# Patient Record
Sex: Female | Born: 1985 | Race: Black or African American | Hispanic: No | Marital: Single | Smoking: Former smoker
Health system: Southern US, Community
[De-identification: ages and names within clinical notes are randomized; demographics above are authoritative.]

## PROBLEM LIST (undated history)

## (undated) DIAGNOSIS — J45909 Unspecified asthma, uncomplicated: Secondary | ICD-10-CM

## (undated) DIAGNOSIS — I10 Essential (primary) hypertension: Secondary | ICD-10-CM

---

## 2019-12-07 ENCOUNTER — Emergency Department (HOSPITAL_BASED_OUTPATIENT_CLINIC_OR_DEPARTMENT_OTHER)
Admission: EM | Admit: 2019-12-07 | Discharge: 2019-12-08 | Disposition: A | Discharge status: Home / Self Care | Payer: Medicaid Other | Attending: Emergency Medicine | Admitting: Emergency Medicine

## 2019-12-07 ENCOUNTER — Other Ambulatory Visit: Payer: Self-pay

## 2019-12-07 DIAGNOSIS — F17200 Nicotine dependence, unspecified, uncomplicated: Secondary | ICD-10-CM | POA: Insufficient documentation

## 2019-12-07 DIAGNOSIS — J45909 Unspecified asthma, uncomplicated: Secondary | ICD-10-CM | POA: Insufficient documentation

## 2019-12-07 DIAGNOSIS — Z20822 Contact with and (suspected) exposure to covid-19: Secondary | ICD-10-CM | POA: Insufficient documentation

## 2019-12-07 DIAGNOSIS — Z760 Encounter for issue of repeat prescription: Secondary | ICD-10-CM

## 2019-12-07 DIAGNOSIS — I10 Essential (primary) hypertension: Secondary | ICD-10-CM | POA: Insufficient documentation

## 2019-12-07 DIAGNOSIS — Q309 Congenital malformation of nose, unspecified: Secondary | ICD-10-CM

## 2019-12-07 HISTORY — DX: Essential (primary) hypertension: I10

## 2019-12-07 HISTORY — DX: Unspecified asthma, uncomplicated: J45.909

## 2019-12-08 ENCOUNTER — Encounter (HOSPITAL_BASED_OUTPATIENT_CLINIC_OR_DEPARTMENT_OTHER): Payer: Self-pay | Admitting: *Deleted

## 2019-12-08 LAB — RESPIRATORY PANEL BY RT PCR (FLU A&B, COVID)
Influenza A by PCR: NEGATIVE
Influenza B by PCR: NEGATIVE
SARS Coronavirus 2 by RT PCR: NEGATIVE

## 2019-12-08 MED ORDER — FLUTICASONE PROPIONATE 50 MCG/ACT NA SUSP: 2.0000 | Freq: Every day | NASAL | 0 refills | Status: DC

## 2019-12-08 MED ORDER — ALBUTEROL SULFATE HFA 108 (90 BASE) MCG/ACT IN AERS
1.0000 | INHALATION_SPRAY | Freq: Four times a day (QID) | RESPIRATORY_TRACT | 0 refills | Status: AC | PRN
Start: 2019-12-08 — End: ?

## 2019-12-08 NOTE — ED Provider Notes (Signed)
[facility] EMERGENCY DEPARTMENT Provider Note   CSN: [id number] Arrival date & time: 12/07/19  2352     History Chief Complaint  Patient presents with  . Asthma    Kristine Warner is a 34 y.o. female.  The history is provided by the patient.  Asthma This is a chronic problem. The current episode started more than 1 week ago. The problem occurs constantly. The problem has been resolved. Pertinent negatives include no chest pain, no abdominal pain, no headaches and no shortness of breath. Nothing aggravates the symptoms. Nothing relieves the symptoms. She has tried nothing for the symptoms. The treatment provided no relief.  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  Now is irritated.  No f/c/r.  No wheezing.  No fevers.       Past Medical History:  Diagnosis Date  . Asthma   . Hypertension     There are no problems to display for this patient.   History reviewed. No pertinent surgical history.   OB History   No obstetric history on file.     No family history on file.  Social History   Tobacco Use  . Smoking status: Current Every Day Smoker  Substance Use Topics  . Alcohol use: Not on file  . Drug use: Not on file    Home Medications Prior to Admission medications   Not on File    Allergies    Patient has no known allergies.  Review of Systems   Review of Systems  Constitutional: Negative for fever.  HENT: Negative for congestion.   Eyes: Negative for visual disturbance.  Respiratory: Negative for shortness of breath.   Cardiovascular: Negative for chest pain.  Gastrointestinal: Negative for abdominal pain.  Genitourinary: Negative for difficulty urinating.  Musculoskeletal: Negative for arthralgias.  Neurological: Negative for headaches.  Psychiatric/Behavioral: Negative for agitation.  All other systems reviewed and are negative.   Physical Exam Updated  Vital Signs BP (!) 160/100   Pulse (!) 104   Temp 99 F (37.2 C) (Oral)   Resp 18   Ht 5\' 11"  (1.803 m)   Wt (!) 189.6 kg   SpO2 100%   BMI 58.30 kg/m   Physical Exam Vitals and nursing note reviewed.  Constitutional:      Appearance: Normal appearance.  HENT:     Head: Normocephalic and atraumatic.     Nose:     Comments: Hole in septum no drainage  Eyes:     Conjunctiva/sclera: Conjunctivae normal.     Pupils: Pupils are equal, round, and reactive to light.  Cardiovascular:     Rate and Rhythm: Normal rate and regular rhythm.     Pulses: Normal pulses.     Heart sounds: Normal heart sounds.  Pulmonary:     Effort: Pulmonary effort is normal.     Breath sounds: Normal breath sounds.  Abdominal:     General: Abdomen is flat. Bowel sounds are normal.     Palpations: Abdomen is soft.     Tenderness: There is no abdominal tenderness. There is no guarding.  Musculoskeletal:        General: Normal range of motion.     Cervical back: Normal range of motion and neck supple.  Skin:    General: Skin is warm and dry.     Capillary Refill: Capillary refill takes less than 2 seconds.  Neurological:  General: No focal deficit present.     Mental Status: She is alert and oriented to person, place, and time.     Deep Tendon Reflexes: Reflexes normal.  Psychiatric:        Mood and Affect: Mood normal.        Behavior: Behavior normal.     ED Results / Procedures / Treatments   Labs (all labs ordered are listed, but only abnormal results are displayed) Labs Reviewed  RESPIRATORY PANEL BY RT PCR (FLU A&B, COVID)    EKG None  Radiology No results found.  Procedures Procedures (including critical care time)  Medications Ordered in ED Medications - No data to display  ED Course  I have reviewed the triage vital signs and the nursing notes.  Pertinent labs & imaging results that were available during my care of the patient were reviewed by me and considered in my  medical decision making (see chart for details).    Flonase nsal spray and follow up with ENT, will refill lost inhaler.    Kristine Warner was evaluated in Emergency Department on 12/08/2019 for the symptoms described in the history of present illness. She was evaluated in the context of the global COVID-19 pandemic, which necessitated consideration that the patient might be at risk for infection with the SARS-CoV-2 virus that causes COVID-19. Institutional protocols and algorithms that pertain to the evaluation of patients at risk for COVID-19 are in a state of rapid change based on information released by regulatory bodies including the CDC and federal and state organizations. These policies and algorithms were followed during the patient's care in the ED.  Final Clinical Impression(s) / ED Diagnoses Return for intractable cough, coughing up blood,fevers >100.4 unrelieved by medication, shortness of breath, intractable vomiting, chest pain, shortness of breath, weakness,numbness, changes in speech, facial asymmetry,abdominal pain, passing out,Inability to tolerate liquids or food, cough, altered mental status or any concerns. No signs of systemic illness or infection. The patient is nontoxic-appearing on exam and vital signs are within normal limits.   I have reviewed the triage vital signs and the nursing notes. Pertinent labs &imaging results that were available during my care of the patient were reviewed by me and considered in my medical decision making (see chart for details).After history, exam, and medical workup I feel the patient has beenappropriately medically screened and is safe for discharge home. Pertinent diagnoses were discussed with the patient. Patient was given return precautions.   Namine Beahm, MD 12/08/19 0023

## 2019-12-08 NOTE — ED Notes (Signed)
Discharge instructions including prescriptions discussed with patient. Verbalized understanding. Departs ED at this time in stable condition.

## 2020-11-17 ENCOUNTER — Encounter (HOSPITAL_BASED_OUTPATIENT_CLINIC_OR_DEPARTMENT_OTHER): Payer: Self-pay | Admitting: *Deleted

## 2020-11-17 ENCOUNTER — Emergency Department (HOSPITAL_BASED_OUTPATIENT_CLINIC_OR_DEPARTMENT_OTHER): Payer: Medicaid Other

## 2020-11-17 ENCOUNTER — Other Ambulatory Visit: Payer: Self-pay

## 2020-11-17 ENCOUNTER — Emergency Department (HOSPITAL_BASED_OUTPATIENT_CLINIC_OR_DEPARTMENT_OTHER)
Admission: EM | Admit: 2020-11-17 | Discharge: 2020-11-17 | Disposition: A | Discharge status: Home / Self Care | Payer: Medicaid Other | Attending: Emergency Medicine | Admitting: Emergency Medicine

## 2020-11-17 DIAGNOSIS — I10 Essential (primary) hypertension: Secondary | ICD-10-CM | POA: Diagnosis not present

## 2020-11-17 DIAGNOSIS — Z87891 Personal history of nicotine dependence: Secondary | ICD-10-CM | POA: Diagnosis not present

## 2020-11-17 DIAGNOSIS — R0602 Shortness of breath: Secondary | ICD-10-CM | POA: Diagnosis present

## 2020-11-17 DIAGNOSIS — J45901 Unspecified asthma with (acute) exacerbation: Secondary | ICD-10-CM | POA: Insufficient documentation

## 2020-11-17 DIAGNOSIS — Z7951 Long term (current) use of inhaled steroids: Secondary | ICD-10-CM | POA: Insufficient documentation

## 2020-11-17 DIAGNOSIS — Z20822 Contact with and (suspected) exposure to covid-19: Secondary | ICD-10-CM | POA: Insufficient documentation

## 2020-11-17 DIAGNOSIS — Z79899 Other long term (current) drug therapy: Secondary | ICD-10-CM | POA: Diagnosis not present

## 2020-11-17 LAB — BRAIN NATRIURETIC PEPTIDE: B Natriuretic Peptide: 20 pg/mL (ref 0.0–100.0)

## 2020-11-17 LAB — CBC WITH DIFFERENTIAL/PLATELET
Abs Immature Granulocytes: 0.03 10*3/uL (ref 0.00–0.07)
Basophils Absolute: 0.1 10*3/uL (ref 0.0–0.1)
Basophils Relative: 1 %
Eosinophils Absolute: 0.9 10*3/uL — ABNORMAL HIGH (ref 0.0–0.5)
Eosinophils Relative: 8 %
HCT: 38.5 % (ref 36.0–46.0)
Hemoglobin: 12.4 g/dL (ref 12.0–15.0)
Immature Granulocytes: 0 %
Lymphocytes Relative: 31 %
Lymphs Abs: 3.3 10*3/uL (ref 0.7–4.0)
MCH: 31.2 pg (ref 26.0–34.0)
MCHC: 32.2 g/dL (ref 30.0–36.0)
MCV: 96.7 fL (ref 80.0–100.0)
Monocytes Absolute: 0.5 10*3/uL (ref 0.1–1.0)
Monocytes Relative: 5 %
Neutro Abs: 5.8 10*3/uL (ref 1.7–7.7)
Neutrophils Relative %: 55 %
Platelets: 393 10*3/uL (ref 150–400)
RBC: 3.98 MIL/uL (ref 3.87–5.11)
RDW: 14.4 % (ref 11.5–15.5)
WBC: 10.6 10*3/uL — ABNORMAL HIGH (ref 4.0–10.5)
nRBC: 0 % (ref 0.0–0.2)

## 2020-11-17 LAB — RESP PANEL BY RT-PCR (FLU A&B, COVID) ARPGX2
Influenza A by PCR: NEGATIVE
Influenza B by PCR: NEGATIVE
SARS Coronavirus 2 by RT PCR: NEGATIVE

## 2020-11-17 LAB — BASIC METABOLIC PANEL
Anion gap: 8 (ref 5–15)
BUN: 15 mg/dL (ref 6–20)
CO2: 24 mmol/L (ref 22–32)
Calcium: 8.5 mg/dL — ABNORMAL LOW (ref 8.9–10.3)
Chloride: 103 mmol/L (ref 98–111)
Creatinine, Ser: 1.03 mg/dL — ABNORMAL HIGH (ref 0.44–1.00)
GFR, Estimated: >60 mL/min (ref >60)
Glucose, Bld: 114 mg/dL — ABNORMAL HIGH (ref 70–99)
Potassium: 3.6 mmol/L (ref 3.5–5.1)
Sodium: 135 mmol/L (ref 135–145)

## 2020-11-17 LAB — HCG, SERUM, QUALITATIVE: Preg, Serum: NEGATIVE

## 2020-11-17 MED ORDER — CETIRIZINE HCL 10 MG PO TABS: 10.0000 mg | ORAL_TABLET | Freq: Every day | ORAL | 0 refills | Status: AC

## 2020-11-17 MED ORDER — METHYLPREDNISOLONE SODIUM SUCC 125 MG IJ SOLR
125.0000 mg | INTRAMUSCULAR | Status: AC
  Administered 2020-11-17: 125 mg via INTRAVENOUS
  Filled 2020-11-17: qty 2

## 2020-11-17 MED ORDER — MAGNESIUM SULFATE 2 GM/50ML IV SOLN
2.0000 g | Freq: Once | INTRAVENOUS | Status: AC
  Administered 2020-11-17: 2 g via INTRAVENOUS
  Filled 2020-11-17: qty 50

## 2020-11-17 MED ORDER — SODIUM CHLORIDE 0.9 % IV SOLN: INTRAVENOUS | Status: DC | PRN

## 2020-11-17 MED ORDER — IPRATROPIUM BROMIDE 0.02 % IN SOLN
RESPIRATORY_TRACT | Status: AC
  Administered 2020-11-17: 0.5 mg
  Filled 2020-11-17: qty 2.5

## 2020-11-17 MED ORDER — ALBUTEROL SULFATE HFA 108 (90 BASE) MCG/ACT IN AERS
1.0000 | INHALATION_SPRAY | Freq: Once | RESPIRATORY_TRACT | Status: AC
  Administered 2020-11-17: 1 via RESPIRATORY_TRACT
  Filled 2020-11-17: qty 6.7

## 2020-11-17 MED ORDER — ALBUTEROL SULFATE (2.5 MG/3ML) 0.083% IN NEBU
INHALATION_SOLUTION | RESPIRATORY_TRACT | Status: AC
  Administered 2020-11-17: 5 mg
  Filled 2020-11-17: qty 6

## 2020-11-17 MED ORDER — AEROCHAMBER PLUS FLO-VU MEDIUM MISC
1.0000 | Freq: Once | Status: AC
  Administered 2020-11-17: 1
  Filled 2020-11-17: qty 1

## 2020-11-17 MED ORDER — PREDNISONE 10 MG PO TABS: 40.0000 mg | ORAL_TABLET | Freq: Every day | ORAL | 0 refills | Status: DC

## 2020-11-17 MED ORDER — FLUTICASONE PROPIONATE 50 MCG/ACT NA SUSP: 2.0000 | Freq: Every day | NASAL | 0 refills | Status: AC

## 2020-11-17 NOTE — ED Provider Notes (Signed)
[facility] EMERGENCY DEPARTMENT Provider Note   CSN: [id number] Arrival date & time: 11/17/20  1553     History Chief Complaint  Patient presents with   Asthma    Kristine Warner is a 35 y.o. female.  HPI Patient is a 35 year old female with past medical history significant for asthma hypertension she is presented to the emergency room today with complaints of shortness of breath, wheezing, cough states that her symptoms began 20 to 30 minutes prior to arrival in the emergency room.  She states she feels as her symptoms came on relatively abruptly she states that she does work for a [employer] it was in a house with somebody without a dog.  She states that she has no history of anaphylaxis.  Has never had to have epinephrine or to be intubated for her asthma.  She uses a inhaled corticosteroid inhaler every day and did not use it today however.  Does not have a rescue inhaler   She denies any chest pain but states that she feels quite short of breath.  No fevers or chills.  No hemoptysis no history of blood clots or cancer.  No leg pain or swelling.      Past Medical History:  Diagnosis Date   Asthma    Hypertension     There are no problems to display for this patient.   History reviewed. No pertinent surgical history.   OB History   No obstetric history on file.     No family history on file.  Social History   Tobacco Use   Smoking status: Former    Types: Cigarettes   Smokeless tobacco: Never  Vaping Use   Vaping Use: Every day  Substance Use Topics   Alcohol use: Yes   Drug use: Yes    Types: Cocaine    Home Medications Prior to Admission medications   Medication Sig Start Date End Date Taking? Authorizing Provider  albuterol (VENTOLIN HFA) 108 (90 Base) MCG/ACT inhaler Inhale 1-2 puffs into the lungs every 6 (six) hours as needed for wheezing or shortness of breath. 12/08/19  Yes Palumbo, April, MD  budesonide-formoterol [facility])  160-4.5 MCG/ACT inhaler Inhale 2 puffs into the lungs 2 (two) times daily.   Yes [provider]  cetirizine (ZYRTEC ALLERGY) 10 MG tablet Take 1 tablet (10 mg total) by mouth daily. 11/17/20  Yes Leighanne Adolph S, PA  fluticasone (FLONASE) 50 MCG/ACT nasal spray Place 2 sprays into both nostrils daily for 14 days. 11/17/20 12/01/20 Yes Al[place]ace Dowd S, PA  hydrochlorothiazide (MICROZIDE) 12.5 MG capsule Take 12.5 mg by mouth daily.   Yes [provider]  lisinopril (ZESTRIL) 10 MG tablet Take 10 mg by mouth daily.   Yes [provider]  predniSONE (DELTASONE) 10 MG tablet Take 4 tablets (40 mg total) by mouth daily. 11/17/20  Yes Gailen Shelter, PA    Allergies    Patient has no known allergies.  Review of Systems   Review of Systems  Constitutional:  Negative for chills and fever.  HENT:  Negative for congestion.   Eyes:  Negative for pain.  Respiratory:  Positive for cough, shortness of breath and wheezing.   Cardiovascular:  Negative for chest pain and leg swelling.  Gastrointestinal:  Negative for abdominal pain and vomiting.  Genitourinary:  Negative for dysuria.  Musculoskeletal:  Negative for myalgias.  Skin:  Negative for rash.  Neurological:  Negative for dizziness and headaches.   Physical Exam Updated Vital  Signs BP (!) 144/89   Pulse 91   Temp (!) 97.5 F (36.4 C) (Tympanic)   Resp 16   Ht 5' 11.5" (1.816 m)   Wt (!) 189.6 kg   SpO2 98%   BMI 57.49 kg/m   Physical Exam Vitals and nursing note reviewed.  Constitutional:      Comments: Uncomfortable appearing 35 year old female audibly wheezing, increased work of breathing, tachypneic  HENT:     Head: Normocephalic and atraumatic.     Nose: Nose normal.     Mouth/Throat:     Mouth: Mucous membranes are moist.  Eyes:     General: No scleral icterus. Cardiovascular:     Rate and Rhythm: Normal rate and regular rhythm.     Pulses: Normal pulses.     Heart sounds: Normal heart  sounds.  Pulmonary:     Breath sounds: Wheezing present.     Comments: Tachypneic, increased expiratory phase, increased work of breathing  Diffuse wheezing throughout all lung fields.  Reasonable tidal volumes however. Able to speak in short but complete sentences. Abdominal:     Palpations: Abdomen is soft.     Tenderness: There is no abdominal tenderness. There is no guarding or rebound.  Musculoskeletal:     Cervical back: Normal range of motion.     Right lower leg: No edema.     Left lower leg: No edema.     Comments: No lower extremity edema or calf tenderness  Skin:    General: Skin is warm and dry.     Capillary Refill: Capillary refill takes less than 2 seconds.  Neurological:     Mental Status: She is alert. Mental status is at baseline.  Psychiatric:        Mood and Affect: Mood normal.        Behavior: Behavior normal.    ED Results / Procedures / Treatments   Labs (all labs ordered are listed, but only abnormal results are displayed) Labs Reviewed  CBC WITH DIFFERENTIAL/PLATELET - Abnormal; Notable for the following components:      Result Value   WBC 10.6 (*)    Eosinophils Absolute 0.9 (*)    All other components within normal limits  BASIC METABOLIC PANEL - Abnormal; Notable for the following components:   Glucose, Bld 114 (*)    Creatinine, Ser 1.03 (*)    Calcium 8.5 (*)    All other components within normal limits  RESP PANEL BY RT-PCR (FLU A&B, COVID) [id number]  BRAIN NATRIURETIC PEPTIDE  HCG, SERUM, QUALITATIVE    EKG None  Radiology DG Chest Portable 1 View  Result Date: 11/17/2020 CLINICAL DATA:  Shortness of breath, history of asthma. EXAM: PORTABLE CHEST 1 VIEW COMPARISON:  May 22, 2017. FINDINGS: EKG leads project over the chest. Cardiomediastinal contours and hilar structures are normal. No consolidation.  No pleural effusion. On limited assessment there is no acute skeletal process. IMPRESSION: No acute cardiopulmonary disease.  Electronically Signed   By: Donzetta Kohut M.D.   On: 11/17/2020 16:35    Procedures Procedures   Medications Ordered in ED Medications  0.9 %  sodium chloride infusion (0 mLs Intravenous Stopped 11/17/20 1821)  albuterol (PROVENTIL) (2.5 MG/3ML) 0.083% nebulizer solution (5 mg  Given 11/17/20 1602)  ipratropium (ATROVENT) 0.02 % nebulizer solution (0.5 mg  Given 11/17/20 1602)  methylPREDNISolone sodium succinate (SOLU-MEDROL) 125 mg/2 mL injection 125 mg (125 mg Intravenous Given 11/17/20 1637)  magnesium sulfate IVPB 2 g 50 mL (0 g Intravenous  Stopped 11/17/20 1807)  albuterol (VENTOLIN HFA) 108 (90 Base) MCG/ACT inhaler 1 puff (1 puff Inhalation Given 11/17/20 1813)  AeroChamber Plus Flo-Vu Medium MISC 1 each (1 each Other Given 11/17/20 1814)    ED Course  I have reviewed the triage vital signs and the nursing notes.  Pertinent labs & imaging results that were available during my care of the patient were reviewed by me and considered in my medical decision making (see chart for details).    MDM Rules/Calculators/A&P                          Patient initially presented to the ER with increased work of breathing tachypneic and wheezing.  Also tachycardic  Has not used her ICS inhaler today. Does not have an albuterol rescue inhaler  Wheezing diffusely.   Patient started on continuous nebulizer with DuoNeb.  Given magnesium and Solu-Medrol.  I reevaluated patient.  5 minutes x 3.  Work of breathing significantly improved.  Tachycardia and tachypnea resolved completely.  She is no longer wheezing on my reexamination.  COVID and influenza negative.  BMP unremarkable.  hCG negative for pregnancy.  BMP unremarkable CBC with very mild leukocytosis no anemia.  EKG nonischemic.  No chest pain.  Chest x-ray unremarkable.  No infiltrates.  On my reassessment prior to discharge patient continues to be breathing well.  States that she feels much improved.  Ambulate without  difficulty pulse ox 98% on room air.  Provide patient with albuterol rescue inhaler and spacer We will discharge patient home with strict return precautions.  She is understanding of plan and agreeable.  All questions answered best my ability.  Kristine Warner was evaluated in Emergency Department on 11/18/2020 for the symptoms described in the history of present illness. She was evaluated in the context of the global COVID-19 pandemic, which necessitated consideration that the patient might be at risk for infection with the SARS-CoV-2 virus that causes COVID-19. Institutional protocols and algorithms that pertain to the evaluation of patients at risk for COVID-19 are in a state of rapid change based on information released by regulatory bodies including the CDC and federal and state organizations. These policies and algorithms were followed during the patient's care in the ED.   Final Clinical Impression(s) / ED Diagnoses Final diagnoses:  Moderate asthma with exacerbation, unspecified whether persistent    Rx / DC Orders ED Discharge Orders          Ordered    predniSONE (DELTASONE) 10 MG tablet  Daily        11/17/20 1804    fluticasone (FLONASE) 50 MCG/ACT nasal spray  Daily        11/17/20 1804    cetirizine (ZYRTEC ALLERGY) 10 MG tablet  Daily        11/17/20 1804             Solon Augusta Bovina, [place] 11/18/20 0046    Alvira Monday, MD 11/19/20 Paulo Fruit

## 2020-11-17 NOTE — ED Notes (Signed)
PEFR 360 x 3. Charted on flow sheet. Patient says she feels much better. No distress noted

## 2020-11-17 NOTE — Discharge Instructions (Signed)
.  Please follow-up with your primary care provider.  Please continue to use your inhaled corticosteroid.  Please use the albuterol rescue inhaler I prescribed you and that you received here in the ER as needed as prescribed.  Please take the entire course of prednisone  I have also prescribed you Zyrtec and fluticasone.  Untreated allergies can worsen asthma attacks.  Please return to the ER for any new or concerning symptoms.

## 2020-11-17 NOTE — ED Triage Notes (Addendum)
Pt  with hx of asthma. Reports SOB x "20 mins". States started at work. Provider at bedside during triage in room 14. Audible wheezing. RR 40s. RT at bedside. States she left her rescue inhaler at home

## 2021-03-25 ENCOUNTER — Encounter (HOSPITAL_BASED_OUTPATIENT_CLINIC_OR_DEPARTMENT_OTHER): Payer: Self-pay

## 2021-03-25 ENCOUNTER — Emergency Department (HOSPITAL_BASED_OUTPATIENT_CLINIC_OR_DEPARTMENT_OTHER)
Admission: EM | Admit: 2021-03-25 | Discharge: 2021-03-25 | Disposition: A | Discharge status: Home / Self Care | Payer: Medicaid Other | Attending: Emergency Medicine | Admitting: Emergency Medicine

## 2021-03-25 ENCOUNTER — Other Ambulatory Visit: Payer: Self-pay

## 2021-03-25 ENCOUNTER — Emergency Department (HOSPITAL_BASED_OUTPATIENT_CLINIC_OR_DEPARTMENT_OTHER): Payer: Medicaid Other

## 2021-03-25 DIAGNOSIS — Z7952 Long term (current) use of systemic steroids: Secondary | ICD-10-CM | POA: Diagnosis not present

## 2021-03-25 DIAGNOSIS — J4541 Moderate persistent asthma with (acute) exacerbation: Secondary | ICD-10-CM | POA: Diagnosis not present

## 2021-03-25 DIAGNOSIS — Z7951 Long term (current) use of inhaled steroids: Secondary | ICD-10-CM | POA: Insufficient documentation

## 2021-03-25 DIAGNOSIS — Z20822 Contact with and (suspected) exposure to covid-19: Secondary | ICD-10-CM | POA: Insufficient documentation

## 2021-03-25 DIAGNOSIS — J45901 Unspecified asthma with (acute) exacerbation: Secondary | ICD-10-CM

## 2021-03-25 DIAGNOSIS — R0602 Shortness of breath: Secondary | ICD-10-CM | POA: Diagnosis present

## 2021-03-25 LAB — CBC WITH DIFFERENTIAL/PLATELET
Abs Immature Granulocytes: 0.01 10*3/uL (ref 0.00–0.07)
Basophils Absolute: 0 10*3/uL (ref 0.0–0.1)
Basophils Relative: 1 %
Eosinophils Absolute: 0.4 10*3/uL (ref 0.0–0.5)
Eosinophils Relative: 4 %
HCT: 39.2 % (ref 36.0–46.0)
Hemoglobin: 12.7 g/dL (ref 12.0–15.0)
Immature Granulocytes: 0 %
Lymphocytes Relative: 37 %
Lymphs Abs: 3.3 10*3/uL (ref 0.7–4.0)
MCH: 31.1 pg (ref 26.0–34.0)
MCHC: 32.4 g/dL (ref 30.0–36.0)
MCV: 95.8 fL (ref 80.0–100.0)
Monocytes Absolute: 0.5 10*3/uL (ref 0.1–1.0)
Monocytes Relative: 5 %
Neutro Abs: 4.8 10*3/uL (ref 1.7–7.7)
Neutrophils Relative %: 53 %
Platelets: 317 10*3/uL (ref 150–400)
RBC: 4.09 MIL/uL (ref 3.87–5.11)
RDW: 14.6 % (ref 11.5–15.5)
WBC: 8.9 10*3/uL (ref 4.0–10.5)
nRBC: 0 % (ref 0.0–0.2)

## 2021-03-25 LAB — RESP PANEL BY RT-PCR (FLU A&B, COVID) ARPGX2
Influenza A by PCR: NEGATIVE
Influenza B by PCR: NEGATIVE
SARS Coronavirus 2 by RT PCR: NEGATIVE

## 2021-03-25 LAB — BASIC METABOLIC PANEL
Anion gap: 5 (ref 5–15)
BUN: 12 mg/dL (ref 6–20)
CO2: 25 mmol/L (ref 22–32)
Calcium: 8.8 mg/dL — ABNORMAL LOW (ref 8.9–10.3)
Chloride: 107 mmol/L (ref 98–111)
Creatinine, Ser: 0.84 mg/dL (ref 0.44–1.00)
GFR, Estimated: >60 mL/min (ref >60)
Glucose, Bld: 85 mg/dL (ref 70–99)
Potassium: 3.9 mmol/L (ref 3.5–5.1)
Sodium: 137 mmol/L (ref 135–145)

## 2021-03-25 MED ORDER — DIPHENHYDRAMINE HCL 50 MG/ML IJ SOLN
25.0000 mg | Freq: Once | INTRAMUSCULAR | Status: AC
  Administered 2021-03-25: 25 mg via INTRAVENOUS
  Filled 2021-03-25: qty 1

## 2021-03-25 MED ORDER — IPRATROPIUM-ALBUTEROL 0.5-2.5 (3) MG/3ML IN SOLN
RESPIRATORY_TRACT | Status: AC
  Filled 2021-03-25: qty 3

## 2021-03-25 MED ORDER — IPRATROPIUM-ALBUTEROL 0.5-2.5 (3) MG/3ML IN SOLN
3.0000 mL | RESPIRATORY_TRACT | Status: AC
  Administered 2021-03-25: 3 mL via RESPIRATORY_TRACT
  Filled 2021-03-25: qty 3

## 2021-03-25 MED ORDER — PREDNISONE 10 MG (21) PO TBPK: ORAL_TABLET | Freq: Every day | ORAL | 0 refills | Status: DC

## 2021-03-25 MED ORDER — ALBUTEROL SULFATE HFA 108 (90 BASE) MCG/ACT IN AERS
4.0000 | INHALATION_SPRAY | Freq: Once | RESPIRATORY_TRACT | Status: AC
  Administered 2021-03-25: 4 via RESPIRATORY_TRACT
  Filled 2021-03-25: qty 6.7

## 2021-03-25 MED ORDER — METHYLPREDNISOLONE SODIUM SUCC 125 MG IJ SOLR
125.0000 mg | INTRAMUSCULAR | Status: AC
  Administered 2021-03-25: 125 mg via INTRAVENOUS
  Filled 2021-03-25: qty 2

## 2021-03-25 MED ORDER — MAGNESIUM SULFATE 2 GM/50ML IV SOLN
2.0000 g | Freq: Once | INTRAVENOUS | Status: AC
  Administered 2021-03-25: 2 g via INTRAVENOUS
  Filled 2021-03-25: qty 50

## 2021-03-25 MED ORDER — PROMETHAZINE-DM 6.25-15 MG/5ML PO SYRP: 5.0000 mL | ORAL_SOLUTION | Freq: Four times a day (QID) | ORAL | 0 refills | Status: AC | PRN

## 2021-03-25 NOTE — Discharge Instructions (Addendum)
Please use the steroids I prescribed you.  Please follow-up closely with your primary care provider.

## 2021-03-25 NOTE — ED Triage Notes (Addendum)
Pt reports she was at clients house and dog ran across lap. Eyes became itchy. since 230pm having difficulty breathing.  Denies any difficulty swallowing.

## 2021-03-25 NOTE — ED Notes (Signed)
Pt A&OX4 ambulatory at d/c with independent steady gait, NAD. Pt verbalized understanding of d/c instructions, prescription and follow up care.Pt reporting she is breathing better and is feeling better.

## 2021-03-25 NOTE — Progress Notes (Signed)
Albuterol MD given to patient to take home. Stated she has an spacer and does not need another

## 2021-03-25 NOTE — ED Provider Notes (Signed)
[facility] EMERGENCY DEPARTMENT Provider Note   CSN: [id number] Arrival date & time: 03/25/21  1535     History  Chief Complaint  Patient presents with   Wheezing   Shortness of Breath    Kristine Warner is a 36 y.o. female.   Wheezing Associated symptoms: shortness of breath   Shortness of Breath Associated symptoms: wheezing    Patient is a 36 year old female longstanding history of asthma she states that at 2:30 PM an hour and a half ago from now she started having wheezing and shortness of breath she states that she was at a client's house and a dog ran across her lap just before this began however she states she has a history of allergies to animals dogs and she denies any rashes nausea vomiting diarrhea lip or tongue swelling difficulty swallowing.  She denies any fevers chills states that she does feel that she has some phlegm in her throat however.  States that she uses 2-3 doses of MDI albuterol per day and just ran out of her albuterol yesterday.  She states that she also uses Symbicort 2 puffs/day and has been compliant with this.  She states that she had 3 or 4 episodes of asthma exacerbations last year has never been intubated.     Home Medications Prior to Admission medications   Medication Sig Start Date End Date Taking? Authorizing Provider  predniSONE (STERAPRED UNI-PAK 21 TAB) 10 MG (21) TBPK tablet Take by mouth daily. Take 6 tabs by mouth daily  for 2 days, then 5 tabs for 2 days, then 4 tabs for 2 days, then 3 tabs for 2 days, 2 tabs for 2 days, then 1 tab by mouth daily for 2 days 03/25/21  Yes Marymargaret Kirker S, PA  promethazine-dextromethorphan (PROMETHAZINE-DM) 6.25-15 MG/5ML syrup Take 5 mLs by mouth 4 (four) times daily as needed for cough. 03/25/21  Yes Keegan Bensch S, PA  albuterol (VENTOLIN HFA) 108 (90 Base) MCG/ACT inhaler Inhale 1-2 puffs into the lungs every 6 (six) hours as needed for wheezing or shortness of breath. 12/08/19    Palumbo, April, MD  budesonide-formoterol [facility]) 160-4.5 MCG/ACT inhaler Inhale 2 puffs into the lungs 2 (two) times daily.    [provider]  cetirizine (ZYRTEC ALLERGY) 10 MG tablet Take 1 tablet (10 mg total) by mouth daily. 11/17/20   Nasha Diss, Rodrigo Ran, PA  fluticasone (FLONASE) 50 MCG/ACT nasal spray Place 2 sprays into both nostrils daily for 14 days. 11/17/20 12/01/20  Gailen Shelter, PA  hydrochlorothiazide (MICROZIDE) 12.5 MG capsule Take 12.5 mg by mouth daily.    [provider]  lisinopril (ZESTRIL) 10 MG tablet Take 10 mg by mouth daily.    [provider]      Allergies    Patient has no known allergies.    Review of Systems   Review of Systems  Respiratory:  Positive for shortness of breath and wheezing.    Physical Exam Updated Vital Signs BP (!) 145/111    Pulse 90    Temp 98.6 F (37 C) (Oral)    Resp 13    SpO2 94%  Physical Exam Vitals and nursing note reviewed.  Constitutional:      General: She is in acute distress.  HENT:     Head: Normocephalic and atraumatic.     Nose: Nose normal.     Mouth/Throat:     Mouth: Mucous membranes are moist.  Eyes:     General: No scleral  icterus. Cardiovascular:     Rate and Rhythm: Normal rate and regular rhythm.     Pulses: Normal pulses.     Heart sounds: Normal heart sounds.  Pulmonary:     Effort: Respiratory distress present.     Breath sounds: Wheezing present.  Abdominal:     Palpations: Abdomen is soft.     Tenderness: There is no abdominal tenderness. There is no guarding or rebound.  Musculoskeletal:     Cervical back: Normal range of motion.     Right lower leg: No edema.     Left lower leg: No edema.  Skin:    General: Skin is warm and dry.     Capillary Refill: Capillary refill takes less than 2 seconds.     Comments: No rash  Neurological:     Mental Status: She is alert. Mental status is at baseline.  Psychiatric:        Mood and Affect: Mood normal.         Behavior: Behavior normal.    ED Results / Procedures / Treatments   Labs (all labs ordered are listed, but only abnormal results are displayed) Labs Reviewed  BASIC METABOLIC PANEL - Abnormal; Notable for the following components:      Result Value   Calcium 8.8 (*)    All other components within normal limits  RESP PANEL BY RT-PCR (FLU A&B, COVID) ARPGX2  CBC WITH DIFFERENTIAL/PLATELET    EKG EKG Interpretation  Date/Time:  Monday March 25 2021 15:50:55 EST Ventricular Rate:  99 PR Interval:  146 QRS Duration: 100 QT Interval:  342 QTC Calculation: 439 R Axis:   35 Text Interpretation: Sinus rhythm Consider left atrial enlargement ST elev, probable normal early repol pattern Baseline wander in lead(s) V3 V6 Confirmed by Marianna Fussykstra, Richard ([id number]) on 03/25/2021 5:29:21 PM  Radiology DG Chest Portable 1 View  Result Date: 03/25/2021 CLINICAL DATA:  Short of breath EXAM: PORTABLE CHEST 1 VIEW COMPARISON:  11/17/2020 FINDINGS: Single frontal view of the chest demonstrates an unremarkable cardiac silhouette. No airspace disease, effusion, or pneumothorax. No acute bony abnormalities. IMPRESSION: 1. No acute intrathoracic process. Electronically Signed   By: Sharlet SalinaMichael  Brown M.D.   On: 03/25/2021 16:09    Procedures Procedures    Medications Ordered in ED Medications  ipratropium-albuterol (DUONEB) 0.5-2.5 (3) MG/3ML nebulizer solution (  Given 03/25/21 1554)  methylPREDNISolone sodium succinate (SOLU-MEDROL) 125 mg/2 mL injection 125 mg (125 mg Intravenous Given 03/25/21 1604)  diphenhydrAMINE (BENADRYL) injection 25 mg (25 mg Intravenous Given 03/25/21 1603)  magnesium sulfate IVPB 2 g 50 mL (0 g Intravenous Stopped 03/25/21 1711)  ipratropium-albuterol (DUONEB) 0.5-2.5 (3) MG/3ML nebulizer solution 3 mL (3 mLs Nebulization Given 03/25/21 1645)  albuterol (VENTOLIN HFA) 108 (90 Base) MCG/ACT inhaler 4 puff (4 puffs Inhalation Given 03/25/21 1645)    ED Course/ Medical Decision  Making/ A&P Clinical Course as of 03/25/21 2056  Mon Mar 25, 2021  1556 Patient is a 36 year old female longstanding history of asthma she states that at 2:30 PM an hour and a half ago from now she started having wheezing and shortness of breath she states that she was at a client's house and a dog ran across her lap just before this began however she states she has a history of allergies to animals dogs and she denies any rashes nausea vomiting diarrhea lip or tongue swelling difficulty swallowing.  She denies any fevers chills states that she does feel that she has some phlegm in  her throat however.  States that she uses 2-3 doses of MDI albuterol per day and just ran out of her albuterol yesterday.  She states that she also uses Symbicort 2 puffs/day and has been compliant with this.  She states that she had 3 or 4 episodes of asthma exacerbations last year has never been intubated. [WF]    Clinical Course User Index [WF] Gailen Shelter, [place]                           Medical Decision Making Amount and/or Complexity of Data Reviewed Labs: ordered. Radiology: ordered.  Risk Prescription drug management.   This patient presents to the ED for concern of shortness of breath, wheezing, this involves a number of treatment options, and is a complaint that carries with it a high risk of complications and morbidity.  The differential diagnosis includes asthma, COPD, reactive airway disease, smoking elation, anaphylaxis   Co morbidities: Discussed in HPI   Brief History:  See clinical course in HPI   Patient responded appropriately to DuoNeb inhalers.  Was given 2 DuoNebs plus MDI albuterol inhalers with significant improvement in wheezing now also completely resolved.  Tachypnea resolved.  She states she feels much improved.  EMR reviewed including pt PMHx, past surgical history and past visits to ER.   See HPI for more details   Lab Tests:  I ordered and independently  interpreted labs.  The pertinent results include:     Metabolic panel without any acute abnormality specifically kidney function within normal limits and no significant electrolyte abnormalities. CBC without leukocytosis or significant anemia.    Imaging Studies:  NAD. I personally reviewed all imaging studies and no acute abnormality found. I agree with radiology interpretation.    Cardiac Monitoring:  The patient was maintained on a cardiac monitor.  I personally viewed and interpreted the cardiac monitored which showed an underlying rhythm of: NSR EKG non-ischemic   Medicines ordered:  I ordered medication including magnesium, albuterol via DuoNeb with ipratropium, Solu-Medrol, Benadryl, MDI inhaler for wheezing short of breath Reevaluation of the patient after these medicines showed that the patient improved I have reviewed the patients home medicines and have made adjustments as needed   Critical Interventions:  Treatment of reactive airway disease   Consults:      Reevaluation:  After the interventions noted above I re-evaluated patient and found that they have :resolved   Social Determinants of Health:  The patient's social determinants of health were a factor in the care of this patient    Problem List / ED Course:  Asthma exacerbation  Patient is a newly improved after treatment for asthma exacerbation.  Her wheezing is improved.  She is ambulatory now no desaturation.  She feels well to go home.  Consider admission however believe that patient would benefit from close follow-up   Dispostion:  After consideration of the diagnostic results and the patients response to treatment, I feel that the patent would benefit from discharge home with close follow-up with PCP.  Consider admission given that patient has had recurrent asthma exacerbations however I think that she would benefit most from close follow-up and outpatient management.  Return precautions  given to patient.  Will discharge home with prednisone taper   She was provided with an MDI inhaler with a spacer here in the ER.  She is ambulatory without discharge.  Feels much improved.    Final Clinical Impression(s) / ED  Diagnoses Final diagnoses:  Moderate asthma with exacerbation, unspecified whether persistent    Rx / DC Orders ED Discharge Orders          Ordered    predniSONE (STERAPRED UNI-PAK 21 TAB) 10 MG (21) TBPK tablet  Daily        03/25/21 1826    promethazine-dextromethorphan (PROMETHAZINE-DM) 6.25-15 MG/5ML syrup  4 times daily PRN        03/25/21 1827              Gailen Shelter, [place] 03/25/21 2056    Milagros Loll, MD 03/25/21 [phone]

## 2021-08-05 ENCOUNTER — Encounter (HOSPITAL_BASED_OUTPATIENT_CLINIC_OR_DEPARTMENT_OTHER): Payer: Self-pay

## 2021-08-05 ENCOUNTER — Other Ambulatory Visit: Payer: Self-pay

## 2021-08-05 ENCOUNTER — Emergency Department (HOSPITAL_BASED_OUTPATIENT_CLINIC_OR_DEPARTMENT_OTHER)
Admission: EM | Admit: 2021-08-05 | Discharge: 2021-08-05 | Disposition: A | Discharge status: Home / Self Care | Payer: Medicaid Other | Attending: Emergency Medicine | Admitting: Emergency Medicine

## 2021-08-05 DIAGNOSIS — R0981 Nasal congestion: Secondary | ICD-10-CM

## 2021-08-05 DIAGNOSIS — J4530 Mild persistent asthma, uncomplicated: Secondary | ICD-10-CM | POA: Diagnosis not present

## 2021-08-05 DIAGNOSIS — J301 Allergic rhinitis due to pollen: Secondary | ICD-10-CM | POA: Diagnosis not present

## 2021-08-05 DIAGNOSIS — R0602 Shortness of breath: Secondary | ICD-10-CM | POA: Diagnosis present

## 2021-08-05 DIAGNOSIS — J302 Other seasonal allergic rhinitis: Secondary | ICD-10-CM

## 2021-08-05 MED ORDER — ALBUTEROL (5 MG/ML) CONTINUOUS INHALATION SOLN
INHALATION_SOLUTION | RESPIRATORY_TRACT | Status: AC
  Administered 2021-08-05: 2.5 mg
  Filled 2021-08-05: qty 0.5

## 2021-08-05 MED ORDER — IPRATROPIUM-ALBUTEROL 0.5-2.5 (3) MG/3ML IN SOLN
RESPIRATORY_TRACT | Status: AC
  Administered 2021-08-05: 3 mL via RESPIRATORY_TRACT
  Filled 2021-08-05: qty 3

## 2021-08-05 MED ORDER — IPRATROPIUM-ALBUTEROL 0.5-2.5 (3) MG/3ML IN SOLN: 3.0000 mL | Freq: Once | RESPIRATORY_TRACT | Status: AC

## 2021-08-05 MED ORDER — ALBUTEROL SULFATE (2.5 MG/3ML) 0.083% IN NEBU: 2.5000 mg | INHALATION_SOLUTION | Freq: Once | RESPIRATORY_TRACT | Status: DC

## 2021-08-05 MED ORDER — ALBUTEROL SULFATE HFA 108 (90 BASE) MCG/ACT IN AERS
2.0000 | INHALATION_SPRAY | Freq: Once | RESPIRATORY_TRACT | Status: AC
  Administered 2021-08-05: 2 via RESPIRATORY_TRACT
  Filled 2021-08-05: qty 6.7

## 2021-08-05 NOTE — ED Provider Notes (Signed)
[facility] EMERGENCY DEPARTMENT Provider Note   CSN: [id number] Arrival date & time: 08/05/21  1421     History  Chief Complaint  Patient presents with   Asthma   Shortness of Breath    Kristine Warner is a 36 y.o. female with history of asthma and hypertension who presents the emergency department complaining of shortness of breath and wheezing for the past 3 days.  Patient states that she has had more nasal congestion, and thinks it is attributed to allergies.  She ran out of her albuterol inhaler 3 days ago, and has a follow-up appointment with her primary doctor on 7/13.  She also complaining of some left eye irritation.   Asthma Associated symptoms include shortness of breath. Pertinent negatives include no chest pain.  Shortness of Breath Associated symptoms: wheezing   Associated symptoms: no chest pain, no cough and no fever        Home Medications Prior to Admission medications   Medication Sig Start Date End Date Taking? Authorizing Provider  albuterol (VENTOLIN HFA) 108 (90 Base) MCG/ACT inhaler Inhale 1-2 puffs into the lungs every 6 (six) hours as needed for wheezing or shortness of breath. 12/08/19   Palumbo, April, MD  budesonide-formoterol [facility]) 160-4.5 MCG/ACT inhaler Inhale 2 puffs into the lungs 2 (two) times daily.    [provider]  cetirizine (ZYRTEC ALLERGY) 10 MG tablet Take 1 tablet (10 mg total) by mouth daily. 11/17/20   Fondaw, Rodrigo Ran, PA  fluticasone (FLONASE) 50 MCG/ACT nasal spray Place 2 sprays into both nostrils daily for 14 days. 11/17/20 12/01/20  Gailen Shelter, PA  hydrochlorothiazide (MICROZIDE) 12.5 MG capsule Take 12.5 mg by mouth daily.    [provider]  lisinopril (ZESTRIL) 10 MG tablet Take 10 mg by mouth daily.    [provider]  predniSONE (STERAPRED UNI-PAK 21 TAB) 10 MG (21) TBPK tablet Take by mouth daily. Take 6 tabs by mouth daily  for 2 days, then 5 tabs for 2 days, then 4 tabs  for 2 days, then 3 tabs for 2 days, 2 tabs for 2 days, then 1 tab by mouth daily for 2 days 03/25/21   Gailen Shelter, PA  promethazine-dextromethorphan (PROMETHAZINE-DM) 6.25-15 MG/5ML syrup Take 5 mLs by mouth 4 (four) times daily as needed for cough. 03/25/21   Gailen Shelter, PA      Allergies    Patient has no known allergies.    Review of Systems   Review of Systems  Constitutional:  Negative for chills and fever.  HENT:  Positive for congestion.   Eyes:  Positive for itching.  Respiratory:  Positive for shortness of breath and wheezing. Negative for cough.   Cardiovascular:  Negative for chest pain.    Physical Exam Updated Vital Signs BP (!) 163/113   Pulse (!) 105   Temp (!) 97.5 F (36.4 C) (Oral)   Resp 20   Ht 5' 11.5" (1.816 m)   Wt (!) 210.9 kg   SpO2 97%   BMI 63.95 kg/m  Physical Exam Vitals and nursing note reviewed.  Constitutional:      Appearance: Normal appearance. She is obese.  HENT:     Head: Normocephalic and atraumatic.     Nose: Congestion present.  Eyes:     Conjunctiva/sclera:     Right eye: Right conjunctiva is not injected. No chemosis, exudate or hemorrhage.    Left eye: Left conjunctiva is injected. No chemosis, exudate or hemorrhage. Cardiovascular:  Rate and Rhythm: Normal rate and regular rhythm.  Pulmonary:     Effort: Pulmonary effort is normal. No respiratory distress.     Breath sounds: Wheezing present.  Abdominal:     General: There is no distension.     Palpations: Abdomen is soft.     Tenderness: There is no abdominal tenderness.  Skin:    General: Skin is warm and dry.  Neurological:     General: No focal deficit present.     Mental Status: She is alert.     ED Results / Procedures / Treatments   Labs (all labs ordered are listed, but only abnormal results are displayed) Labs Reviewed - No data to display  EKG None  Radiology No results found.  Procedures Procedures    Medications Ordered in  ED Medications  albuterol (PROVENTIL) (2.5 MG/3ML) 0.083% nebulizer solution 2.5 mg (2.5 mg Nebulization Not Given 08/05/21 1443)  ipratropium-albuterol (DUONEB) 0.5-2.5 (3) MG/3ML nebulizer solution 3 mL (3 mLs Nebulization Given 08/05/21 1442)  albuterol (VENTOLIN) (5 MG/ML) 0.5% continuous inhalation solution (2.5 mg  Given 08/05/21 1442)  albuterol (VENTOLIN HFA) 108 (90 Base) MCG/ACT inhaler 2 puff (2 puffs Inhalation Given 08/05/21 1606)    ED Course/ Medical Decision Making/ A&P                           Medical Decision Making Risk Prescription drug management.   Patient is a 36 year old female who presents the emergency department complaining shortness of breath and wheezing.  History of asthma and hypertension, ran out of her albuterol inhaler 3 days ago.  Has a follow-up appointment with PCP later this week.  On exam patient has nasal congestion, and mild expiratory wheezing to auscultation.  Slight left eye conjunctival injection, without chemosis.  She is afebrile, with normal oxygen saturation.   Suspect symptoms likely related to asthma secondary to seasonal allergies.  Patient states she is feeling much improved after nebulizer breathing treatment. Low suspicion for severe asthma exacerbation.   After consideration of the diagnostic results and the patients response to treatment, I feel that emergency department workup does not suggest an emergent condition requiring admission or immediate intervention beyond what has been performed at this time. The plan is: Discharge to home with new inhaler, and recommend daily allergy medication.  She is to follow-up with her primary doctor on her scheduled appointment on 7/13.  The patient is safe for discharge and has been instructed to return immediately for worsening symptoms, change in symptoms or any other concerns.        Final Clinical Impression(s) / ED Diagnoses Final diagnoses:  Mild persistent asthma without complication   Nasal congestion  Seasonal allergies    Rx / DC Orders ED Discharge Orders     None      Portions of this report may have been transcribed using voice recognition software. Every effort was made to ensure accuracy; however, inadvertent computerized transcription errors may be present.    Jeanella Flattery 08/05/21 1615    Linwood Dibbles, MD 08/12/21 [phone]

## 2021-08-05 NOTE — Discharge Instructions (Signed)
You were seen in the emergency department today for shortness of breath and nasal congestion.  As we discussed: I think that your congestion is related to allergies.  I want you to start taking allergy medicine every day such as Zyrtec, Claritin, or Allegra.  We refilled your inhaler.  I have attached some instructions for how to do a saline nasal rinse, and this should hopefully give you some relief.  Please follow-up with your primary doctor during your scheduled appointment.

## 2021-08-05 NOTE — ED Notes (Signed)
Patient out of Albuterol HFA since Sunday.

## 2021-08-05 NOTE — ED Notes (Signed)
Patient given discharge instructions, all questions answered. Patient in possession of all belongings, directed to the discharge area  

## 2021-08-05 NOTE — ED Triage Notes (Signed)
Pt c/o sob & wheezing for 3 days. Hasnt used inhaler, states ran out. States nose is congested, nares appear irritated. Also c/o left eye pain.

## 2022-01-15 ENCOUNTER — Emergency Department (HOSPITAL_BASED_OUTPATIENT_CLINIC_OR_DEPARTMENT_OTHER): Payer: Medicaid Other

## 2022-01-15 ENCOUNTER — Emergency Department (HOSPITAL_BASED_OUTPATIENT_CLINIC_OR_DEPARTMENT_OTHER)
Admission: EM | Admit: 2022-01-15 | Discharge: 2022-01-15 | Disposition: A | Discharge status: Home / Self Care | Payer: Medicaid Other | Attending: Emergency Medicine | Admitting: Emergency Medicine

## 2022-01-15 ENCOUNTER — Other Ambulatory Visit: Payer: Self-pay

## 2022-01-15 DIAGNOSIS — Z7951 Long term (current) use of inhaled steroids: Secondary | ICD-10-CM | POA: Diagnosis not present

## 2022-01-15 DIAGNOSIS — Z1152 Encounter for screening for COVID-19: Secondary | ICD-10-CM | POA: Diagnosis not present

## 2022-01-15 DIAGNOSIS — R0602 Shortness of breath: Secondary | ICD-10-CM | POA: Diagnosis present

## 2022-01-15 DIAGNOSIS — J4541 Moderate persistent asthma with (acute) exacerbation: Secondary | ICD-10-CM | POA: Diagnosis not present

## 2022-01-15 DIAGNOSIS — I10 Essential (primary) hypertension: Secondary | ICD-10-CM | POA: Diagnosis not present

## 2022-01-15 DIAGNOSIS — Z79899 Other long term (current) drug therapy: Secondary | ICD-10-CM | POA: Insufficient documentation

## 2022-01-15 LAB — TROPONIN I (HIGH SENSITIVITY)
Troponin I (High Sensitivity): 2 ng/L (ref <18)
Troponin I (High Sensitivity): 2 ng/L (ref <18)

## 2022-01-15 LAB — RESP PANEL BY RT-PCR (RSV, FLU A&B, COVID)  RVPGX2
Influenza A by PCR: NEGATIVE
Influenza B by PCR: NEGATIVE
Resp Syncytial Virus by PCR: NEGATIVE
SARS Coronavirus 2 by RT PCR: NEGATIVE

## 2022-01-15 LAB — BASIC METABOLIC PANEL
Anion gap: 6 (ref 5–15)
BUN: 11 mg/dL (ref 6–20)
CO2: 23 mmol/L (ref 22–32)
Calcium: 8.4 mg/dL — ABNORMAL LOW (ref 8.9–10.3)
Chloride: 110 mmol/L (ref 98–111)
Creatinine, Ser: 0.96 mg/dL (ref 0.44–1.00)
GFR, Estimated: >60 mL/min (ref >60)
Glucose, Bld: 126 mg/dL — ABNORMAL HIGH (ref 70–99)
Potassium: 4 mmol/L (ref 3.5–5.1)
Sodium: 139 mmol/L (ref 135–145)

## 2022-01-15 LAB — CBC
HCT: 39.3 % (ref 36.0–46.0)
Hemoglobin: 12.4 g/dL (ref 12.0–15.0)
MCH: 30.2 pg (ref 26.0–34.0)
MCHC: 31.6 g/dL (ref 30.0–36.0)
MCV: 95.9 fL (ref 80.0–100.0)
Platelets: 355 10*3/uL (ref 150–400)
RBC: 4.1 MIL/uL (ref 3.87–5.11)
RDW: 15.5 % (ref 11.5–15.5)
WBC: 10.8 10*3/uL — ABNORMAL HIGH (ref 4.0–10.5)
nRBC: 0 % (ref 0.0–0.2)

## 2022-01-15 MED ORDER — IOHEXOL 350 MG/ML SOLN
100.0000 mL | Freq: Once | INTRAVENOUS | Status: AC | PRN
  Administered 2022-01-15: 100 mL via INTRAVENOUS

## 2022-01-15 MED ORDER — ALBUTEROL SULFATE HFA 108 (90 BASE) MCG/ACT IN AERS
2.0000 | INHALATION_SPRAY | Freq: Once | RESPIRATORY_TRACT | Status: AC
  Administered 2022-01-15: 2 via RESPIRATORY_TRACT
  Filled 2022-01-15: qty 6.7

## 2022-01-15 MED ORDER — METHYLPREDNISOLONE SODIUM SUCC 125 MG IJ SOLR
125.0000 mg | Freq: Once | INTRAMUSCULAR | Status: AC
  Administered 2022-01-15: 125 mg via INTRAVENOUS
  Filled 2022-01-15: qty 2

## 2022-01-15 MED ORDER — ALBUTEROL (5 MG/ML) CONTINUOUS INHALATION SOLN
10.0000 mg/h | INHALATION_SOLUTION | RESPIRATORY_TRACT | Status: DC
  Administered 2022-01-15: 10 mg/h via RESPIRATORY_TRACT
  Filled 2022-01-15: qty 20

## 2022-01-15 MED ORDER — ALBUTEROL SULFATE (2.5 MG/3ML) 0.083% IN NEBU
INHALATION_SOLUTION | RESPIRATORY_TRACT | Status: AC
  Administered 2022-01-15: 10 mg
  Filled 2022-01-15: qty 12

## 2022-01-15 MED ORDER — PREDNISONE 10 MG PO TABS: 50.0000 mg | ORAL_TABLET | Freq: Every day | ORAL | 0 refills | Status: AC

## 2022-01-15 MED ORDER — IPRATROPIUM BROMIDE 0.02 % IN SOLN
1.0000 mg | Freq: Once | RESPIRATORY_TRACT | Status: AC
  Administered 2022-01-15: 1 mg via RESPIRATORY_TRACT
  Filled 2022-01-15: qty 5

## 2022-01-15 MED ORDER — ALBUTEROL SULFATE (2.5 MG/3ML) 0.083% IN NEBU
2.5000 mg | INHALATION_SOLUTION | Freq: Once | RESPIRATORY_TRACT | Status: AC
  Administered 2022-01-15: 2.5 mg via RESPIRATORY_TRACT
  Filled 2022-01-15: qty 3

## 2022-01-15 MED ORDER — MAGNESIUM SULFATE 2 GM/50ML IV SOLN
2.0000 g | Freq: Once | INTRAVENOUS | Status: AC
  Administered 2022-01-15: 2 g via INTRAVENOUS
  Filled 2022-01-15: qty 50

## 2022-01-15 MED ORDER — ALBUTEROL SULFATE 1.25 MG/3ML IN NEBU: 1.0000 | INHALATION_SOLUTION | Freq: Four times a day (QID) | RESPIRATORY_TRACT | 12 refills | Status: AC | PRN

## 2022-01-15 MED ORDER — LACTATED RINGERS IV BOLUS
1000.0000 mL | Freq: Once | INTRAVENOUS | Status: AC
  Administered 2022-01-15: 1000 mL via INTRAVENOUS

## 2022-01-15 NOTE — ED Notes (Signed)
Patient finished the CAT. Her vitals are HR 120, RR27, Spo2 100% room air.

## 2022-01-15 NOTE — ED Notes (Signed)
Patient finished 1 hr CAT. The patient tolerated well and vitals are :

## 2022-01-15 NOTE — ED Notes (Signed)
Attempted ultrasound IV insertion X 2 without success. Unable to advance catheter in vein due to scar tissue. Patient tolerated well.Physician notified.

## 2022-01-15 NOTE — ED Notes (Signed)
Patient transported to CT 

## 2022-01-15 NOTE — Discharge Instructions (Addendum)
Prednisone as prescribed and complete the full course. Albuterol as prescribed as needed. Return to the ER for worsening or concerning symptoms.  Follow up with your doctor for recheck.

## 2022-01-15 NOTE — ED Provider Notes (Signed)
[facility] EMERGENCY DEPARTMENT Provider Note   CSN: [id number] Arrival date & time: 01/15/22  1515     History  Chief Complaint  Patient presents with   Shortness of Breath    Kristine Warner is a 36 y.o. female.  36 year old female with history of asthma and HTN presents with cough and congestion x 2-3 days, wheezing and SHOB for the past 2 hours, home nebulizer is broken. Has had to be admitted for asthma related complications previously.  Reports her kids have been home with colds. Denies fevers, chills, lower ext edema.        Home Medications Prior to Admission medications   Medication Sig Start Date End Date Taking? Authorizing Provider  albuterol (ACCUNEB) 1.25 MG/3ML nebulizer solution Take 3 mLs (1.25 mg total) by nebulization every 6 (six) hours as needed for wheezing. 01/15/22  Yes Jeannie Fend, PA-C  predniSONE (DELTASONE) 10 MG tablet Take 5 tablets (50 mg total) by mouth daily for 5 days. 01/15/22 01/20/22 Yes Jeannie Fend, PA-C  albuterol (VENTOLIN HFA) 108 (90 Base) MCG/ACT inhaler Inhale 1-2 puffs into the lungs every 6 (six) hours as needed for wheezing or shortness of breath. 12/08/19   Palumbo, April, MD  budesonide-formoterol [facility]) 160-4.5 MCG/ACT inhaler Inhale 2 puffs into the lungs 2 (two) times daily.    [provider]  cetirizine (ZYRTEC ALLERGY) 10 MG tablet Take 1 tablet (10 mg total) by mouth daily. 11/17/20   Fondaw, Rodrigo Ran, PA  fluticasone (FLONASE) 50 MCG/ACT nasal spray Place 2 sprays into both nostrils daily for 14 days. 11/17/20 12/01/20  Gailen Shelter, PA  hydrochlorothiazide (MICROZIDE) 12.5 MG capsule Take 12.5 mg by mouth daily.    [provider]  lisinopril (ZESTRIL) 10 MG tablet Take 10 mg by mouth daily.    [provider]  promethazine-dextromethorphan (PROMETHAZINE-DM) 6.25-15 MG/5ML syrup Take 5 mLs by mouth 4 (four) times daily as needed for cough. 03/25/21   Gailen Shelter, PA       Allergies    Patient has no known allergies.    Review of Systems   Review of Systems Negative except as per HPI Physical Exam Updated Vital Signs BP (!) 140/63   Pulse (!) 115   Temp 98.4 F (36.9 C) (Oral)   Resp (!) 27   Ht 5\' 11"  (1.803 m)   Wt (!) 200 kg   SpO2 96%   BMI 61.50 kg/m  Physical Exam Vitals and nursing note reviewed.  Constitutional:      General: She is in acute distress.     Appearance: She is well-developed. She is not diaphoretic.     Comments: Speaks in short sentences, on neb currently   HENT:     Head: Normocephalic and atraumatic.  Cardiovascular:     Rate and Rhythm: Regular rhythm. Tachycardia present.  Pulmonary:     Effort: Pulmonary effort is normal.     Breath sounds: Decreased breath sounds and wheezing present.  Musculoskeletal:     Right lower leg: No edema.     Left lower leg: No edema.  Skin:    General: Skin is warm and dry.     Findings: No erythema.     Nails: There is no clubbing.  Neurological:     Mental Status: She is alert and oriented to person, place, and time.  Psychiatric:        Behavior: Behavior normal.     ED Results / Procedures /  Treatments   Labs (all labs ordered are listed, but only abnormal results are displayed) Labs Reviewed  BASIC METABOLIC PANEL - Abnormal; Notable for the following components:      Result Value   Glucose, Bld 126 (*)    Calcium 8.4 (*)    All other components within normal limits  CBC - Abnormal; Notable for the following components:   WBC 10.8 (*)    All other components within normal limits  RESP PANEL BY RT-PCR (RSV, FLU A&B, COVID)  [id number]  TROPONIN I (HIGH SENSITIVITY)  TROPONIN I (HIGH SENSITIVITY)    EKG EKG Interpretation  Date/Time:  Wednesday January 15 2022 15:21:54 EST Ventricular Rate:  113 PR Interval:  136 QRS Duration: 92 QT Interval:  332 QTC Calculation: 455 R Axis:   64 Text Interpretation: Sinus tachycardia Right atrial enlargement  Anterior infarct , age undetermined ST & T wave abnormality, consider inferior ischemia Abnormal ECG Interpretation limited secondary to artifact No significant change since last tracing Confirmed by Elayne Snare ([id number]) on 01/15/2022 3:56:10 PM  Radiology CT Angio Chest PE W/Cm &/Or Wo Cm  Result Date: 01/15/2022 CLINICAL DATA:  Shortness of breath, history of asthma, wheezing, high clinical suspicion for pulmonary embolism EXAM: CT ANGIOGRAPHY CHEST WITH CONTRAST TECHNIQUE: Multidetector CT imaging of the chest was performed using the standard protocol during bolus administration of intravenous contrast. Multiplanar CT image reconstructions and MIPs were obtained to evaluate the vascular anatomy. RADIATION DOSE REDUCTION: This exam was performed according to the departmental dose-optimization program which includes automated exposure control, adjustment of the mA and/or kV according to patient size and/or use of iterative reconstruction technique. CONTRAST:  OMNIPAQUE IOHEXOL 350 MG/ML SOLN COMPARISON:  Previous studies including the chest radiograph done earlier today FINDINGS: Cardiovascular: There is homogeneous enhancement in thoracic aorta. There are no intraluminal filling defects in central pulmonary artery branches. Contrast density in the small peripheral branches he is less than optimal. Mediastinum/Nodes: No significant lymphadenopathy is seen. Lungs/Pleura: There is no focal pulmonary consolidation. There are small linear densities in lingula and right lower lobe suggesting scarring or subsegmental atelectasis. There is no pleural effusion or pneumothorax. Upper Abdomen: Unremarkable. Musculoskeletal: No acute findings are seen. Review of the MIP images confirms the above findings. IMPRESSION: There is no evidence of central pulmonary artery embolism. There is no evidence of thoracic aortic dissection. There is no focal pulmonary consolidation. There is no pleural effusion or  pneumothorax. Small linear densities in lingula and right lower lobe suggest subsegmental atelectasis or scarring. Electronically Signed   By: Ernie Avena M.D.   On: 01/15/2022 21:00   DG Chest Port 1 View  Result Date: 01/15/2022 CLINICAL DATA:  Short of breath for 2 hours, asthma attack EXAM: PORTABLE CHEST 1 VIEW COMPARISON:  03/25/2021 FINDINGS: Single frontal view of the chest was obtained, limited by portable technique and body habitus. The cardiac silhouette is unremarkable. No airspace disease, effusion, or pneumothorax. No acute bony abnormalities. IMPRESSION: 1. No acute intrathoracic process. Electronically Signed   By: Sharlet Salina M.D.   On: 01/15/2022 17:07    Procedures .Critical Care  Performed by: Jeannie Fend, PA-C Authorized by: Jeannie Fend, PA-C   Critical care provider statement:    Critical care time (minutes):  30   Critical care was time spent personally by me on the following activities:  Development of treatment plan with patient or surrogate, discussions with consultants, evaluation of patient's response to treatment, examination of patient, ordering and  review of laboratory studies, ordering and review of radiographic studies, ordering and performing treatments and interventions, pulse oximetry, re-evaluation of patient's condition and review of old charts     Medications Ordered in ED Medications  albuterol (PROVENTIL,VENTOLIN) solution continuous neb (0 mg/hr Nebulization Stopped 01/15/22 2252)  albuterol (PROVENTIL) (2.5 MG/3ML) 0.083% nebulizer solution 2.5 mg (2.5 mg Nebulization Given 01/15/22 1522)  methylPREDNISolone sodium succinate (SOLU-MEDROL) 125 mg/2 mL injection 125 mg (125 mg Intravenous Given 01/15/22 1557)  ipratropium (ATROVENT) nebulizer solution 1 mg (1 mg Nebulization Given 01/15/22 2027)  albuterol (PROVENTIL) (2.5 MG/3ML) 0.083% nebulizer solution (10 mg  Given 01/15/22 2026)  magnesium sulfate IVPB 2 g 50 mL (0 g  Intravenous Stopped 01/15/22 2130)  lactated ringers bolus 1,000 mL (0 mLs Intravenous Stopped 01/15/22 2239)  iohexol (OMNIPAQUE) 350 MG/ML injection 100 mL (100 mLs Intravenous Contrast Given 01/15/22 2041)  albuterol (VENTOLIN HFA) 108 (90 Base) MCG/ACT inhaler 2 puff (2 puffs Inhalation Given 01/15/22 2202)    ED Course/ Medical Decision Making/ A&P                           Medical Decision Making Amount and/or Complexity of Data Reviewed Labs: ordered. Radiology: ordered.  Risk Prescription drug management.   This patient presents to the ED for concern of chest tightness, wheezing, SHOB, URI symptoms, this involves an extensive number of treatment options, and is a complaint that carries with it a high risk of complications and morbidity.  The differential diagnosis includes but not limited to asthma exacerbation, viral illness with pneumonia, PE or ACS   Co morbidities that complicate the patient evaluation  Asthma, obesity, hypertension   Additional history obtained:  External records from outside source obtained and reviewed including prior chest x-ray on file, prior labs   Lab Tests:  I Ordered, and personally interpreted labs.  The pertinent results include: CBC with mild leukocytosis white count of 10.8.  Troponin 2 and 2.  COVID/flu/RSV negative.  BMP with mild hyperglycemia at 126.   Imaging Studies ordered:  I ordered imaging studies including chest x-ray, CTA chest PE study I independently visualized and interpreted imaging which showed CTA negative for PE.  Chest x-ray without acute process I agree with the radiologist interpretation   Cardiac Monitoring: / EKG:  The patient was maintained on a cardiac monitor.  I personally viewed and interpreted the cardiac monitored which showed an underlying rhythm of: Sinus tachycardia, rate 113   Consultations Obtained:  I requested consultation with the ER attending, Dr. Theresia LoKingsley,  and discussed lab and  imaging findings as well as pertinent plan - they recommend: Continuous nebs, magnesium, solumedrol, dc to recheck with PCP as patient's symptoms have significant improved.    Problem List / ED Course / Critical interventions / Medication management  36 year old female arrives to the emergency room with concern for difficulty breathing, she is tachypneic, able to speak in short sentences and on a continuous neb and distress when first evaluated.  She reports exposure to family members who have had recent URI symptoms, she is afebrile.  Patient is provided with continuous neb, on reassessment, continues to have profound wheezing although reports she is feeling improved.  She remains tachycardic for several hours after the continuous neb.  Visual treatments were ordered as well as IV magnesium and a CTA of the chest to evaluate for PE or other causes for her shortness of breath today.  Her workup today is largely  reassuring.  After multiple medications, her asthma symptoms are controlled.  She would like to be discharged to follow-up with her primary care provider with return to ER precautions.  Discharge with refills of her albuterol as well as 5-day course of prednisone. I ordered medication including continuous nebulizer, Atrovent, magnesium, for asthma exacerbation Reevaluation of the patient after these medicines showed that the patient resolved I have reviewed the patients home medicines and have made adjustments as needed   Social Determinants of Health:  Has PCP   Test / Admission - Considered:  A comprehensive ER evaluation and treatment, patient has improved and felt stable for discharge home to recheck with her primary care provider return to ER precautions provided.         Final Clinical Impression(s) / ED Diagnoses Final diagnoses:  Moderate persistent asthma with exacerbation    Rx / DC Orders ED Discharge Orders          Ordered    albuterol (ACCUNEB) 1.25 MG/3ML  nebulizer solution  Every 6 hours PRN        01/15/22 2141    predniSONE (DELTASONE) 10 MG tablet  Daily        01/15/22 2232              Jeannie Fend, PA-C 01/15/22 2323    Elayne Snare K, DO 01/15/22 2330

## 2022-01-15 NOTE — ED Triage Notes (Signed)
Pt SOB started 2 hours ago. History of asthma and out of rescue inhaler. Pt audible wheezing, respiratory in triage.

## 2022-08-17 ENCOUNTER — Encounter (HOSPITAL_BASED_OUTPATIENT_CLINIC_OR_DEPARTMENT_OTHER): Payer: Self-pay | Admitting: Urology

## 2022-08-17 ENCOUNTER — Emergency Department (HOSPITAL_BASED_OUTPATIENT_CLINIC_OR_DEPARTMENT_OTHER)
Admission: EM | Admit: 2022-08-17 | Discharge: 2022-08-17 | Disposition: A | Discharge status: Home / Self Care | Payer: Medicaid Other | Source: Home / Self Care | Attending: Emergency Medicine | Admitting: Emergency Medicine

## 2022-08-17 DIAGNOSIS — Z6841 Body Mass Index (BMI) 40.0 and over, adult: Secondary | ICD-10-CM | POA: Diagnosis not present

## 2022-08-17 DIAGNOSIS — J45909 Unspecified asthma, uncomplicated: Secondary | ICD-10-CM | POA: Insufficient documentation

## 2022-08-17 DIAGNOSIS — E669 Obesity, unspecified: Secondary | ICD-10-CM | POA: Insufficient documentation

## 2022-08-17 DIAGNOSIS — N760 Acute vaginitis: Secondary | ICD-10-CM | POA: Insufficient documentation

## 2022-08-17 DIAGNOSIS — Z202 Contact with and (suspected) exposure to infections with a predominantly sexual mode of transmission: Secondary | ICD-10-CM | POA: Insufficient documentation

## 2022-08-17 DIAGNOSIS — I10 Essential (primary) hypertension: Secondary | ICD-10-CM | POA: Insufficient documentation

## 2022-08-17 DIAGNOSIS — B9689 Other specified bacterial agents as the cause of diseases classified elsewhere: Secondary | ICD-10-CM | POA: Insufficient documentation

## 2022-08-17 DIAGNOSIS — N898 Other specified noninflammatory disorders of vagina: Secondary | ICD-10-CM | POA: Diagnosis present

## 2022-08-17 LAB — URINALYSIS, ROUTINE W REFLEX MICROSCOPIC
Bilirubin Urine: NEGATIVE
Glucose, UA: NEGATIVE mg/dL
Hgb urine dipstick: NEGATIVE
Ketones, ur: NEGATIVE mg/dL
Nitrite: NEGATIVE
Protein, ur: NEGATIVE mg/dL
Specific Gravity, Urine: >=1.03 (ref 1.005–1.030)
pH: 5.5 (ref 5.0–8.0)

## 2022-08-17 LAB — URINALYSIS, MICROSCOPIC (REFLEX): RBC / HPF: NONE SEEN RBC/hpf (ref 0–5)

## 2022-08-17 LAB — WET PREP, GENITAL
Sperm: NONE SEEN
Trich, Wet Prep: NONE SEEN
WBC, Wet Prep HPF POC: <10 (ref <10)
Yeast Wet Prep HPF POC: NONE SEEN

## 2022-08-17 LAB — PREGNANCY, URINE: Preg Test, Ur: NEGATIVE

## 2022-08-17 MED ORDER — LIDOCAINE HCL (PF) 1 % IJ SOLN
1.0000 mL | Freq: Once | INTRAMUSCULAR | Status: AC
  Administered 2022-08-17: 1.2 mL
  Filled 2022-08-17: qty 5

## 2022-08-17 MED ORDER — METRONIDAZOLE 500 MG PO TABS: 500.0000 mg | ORAL_TABLET | Freq: Two times a day (BID) | ORAL | 0 refills | Status: AC

## 2022-08-17 MED ORDER — DOXYCYCLINE HYCLATE 100 MG PO TABS: 100.0000 mg | ORAL_TABLET | Freq: Two times a day (BID) | ORAL | 0 refills | Status: DC

## 2022-08-17 MED ORDER — CEFTRIAXONE SODIUM 1 G IJ SOLR
1.0000 g | Freq: Once | INTRAMUSCULAR | Status: AC
  Administered 2022-08-17: 1 g via INTRAMUSCULAR
  Filled 2022-08-17: qty 10

## 2022-08-17 NOTE — ED Provider Notes (Signed)
[place] EMERGENCY DEPARTMENT AT [facility] Provider Note   CSN: [id number] Arrival date & time: 08/17/22  1541     History  Chief Complaint  Patient presents with   SEXUALLY TRANSMITTED DISEASE    Kristine Warner is a 37 y.o. female.  With history of asthma, hypertension who presents to the ED for evaluation of concern for STD.  She states she is typically sexually active in a monogamous relationship with female partner.  Typically does not use protection and has relationship.  She states 1 week ago she cheated on her significant other with another female and had unprotected intercourse.  She has had lower abdominal pressure and vaginal spotting for the past 4 days.  She states she got her IUD taken out weeks ago and is unsure if vitamin D is due to this or if it is due to a STD.  She does not have any known exposure to STDs.  She reports a tingling sensation when she urinates.  Denies any fevers or chills.  No nausea or vomiting.  No abdominal pain.  HPI     Home Medications Prior to Admission medications   Medication Sig Start Date End Date Taking? Authorizing Provider  doxycycline (VIBRA-TABS) 100 MG tablet Take 1 tablet (100 mg total) by mouth 2 (two) times daily. 08/17/22  Yes Dravyn Severs, Edsel Petrin, PA-C  metroNIDAZOLE (FLAGYL) 500 MG tablet Take 1 tablet (500 mg total) by mouth 2 (two) times daily. 08/17/22  Yes Solace Manwarren, Edsel Petrin, PA-C  albuterol (ACCUNEB) 1.25 MG/3ML nebulizer solution Take 3 mLs (1.25 mg total) by nebulization every 6 (six) hours as needed for wheezing. 01/15/22   Jeannie Fend, PA-C  albuterol (VENTOLIN HFA) 108 (90 Base) MCG/ACT inhaler Inhale 1-2 puffs into the lungs every 6 (six) hours as needed for wheezing or shortness of breath. 12/08/19   Palumbo, April, MD  budesonide-formoterol [facility]) 160-4.5 MCG/ACT inhaler Inhale 2 puffs into the lungs 2 (two) times daily.    [provider]  cetirizine (ZYRTEC ALLERGY) 10 MG tablet Take 1  tablet (10 mg total) by mouth daily. 11/17/20   Fondaw, Rodrigo Ran, PA  fluticasone (FLONASE) 50 MCG/ACT nasal spray Place 2 sprays into both nostrils daily for 14 days. 11/17/20 12/01/20  Gailen Shelter, PA  hydrochlorothiazide (MICROZIDE) 12.5 MG capsule Take 12.5 mg by mouth daily.    [provider]  lisinopril (ZESTRIL) 10 MG tablet Take 10 mg by mouth daily.    [provider]  promethazine-dextromethorphan (PROMETHAZINE-DM) 6.25-15 MG/5ML syrup Take 5 mLs by mouth 4 (four) times daily as needed for cough. 03/25/21   Gailen Shelter, PA      Allergies    Patient has no known allergies.    Review of Systems   Review of Systems  Genitourinary:  Positive for dysuria, pelvic pain and vaginal discharge.  All other systems reviewed and are negative.   Physical Exam Updated Vital Signs BP (!) 149/92 (BP Location: Left Arm)   Pulse (!) 102   Temp (!) 97.3 F (36.3 C)   Resp 18   Ht 5\' 11"  (1.803 m)   Wt (!) 216.4 kg   LMP  (LMP Unknown)   SpO2 91%   BMI 66.53 kg/m  Physical Exam Vitals and nursing note reviewed. Exam conducted with a chaperone present (Carissa, NT).  Constitutional:      General: She is not in acute distress.    Appearance: She is obese. She is not ill-appearing.  HENT:  Head: Normocephalic and atraumatic.  Pulmonary:     Effort: Pulmonary effort is normal. No respiratory distress.  Abdominal:     General: Abdomen is flat.  Genitourinary:    Comments: No vulvar or labial rashes or lesions.  Small amount of white vaginal discharge.  No vaginal bleeding, erythema or cervical petechiae.  No cervical motion tenderness.  No adnexal tenderness or fullness bilaterally.  No uterine tenderness. Musculoskeletal:        General: Normal range of motion.     Cervical back: Neck supple.  Skin:    General: Skin is warm and dry.  Neurological:     Mental Status: She is alert and oriented to person, place, and time.  Psychiatric:        Mood and  Affect: Mood normal.        Behavior: Behavior normal.     ED Results / Procedures / Treatments   Labs (all labs ordered are listed, but only abnormal results are displayed) Labs Reviewed  WET PREP, GENITAL - Abnormal; Notable for the following components:      Result Value   Clue Cells Wet Prep HPF POC PRESENT (*)    All other components within normal limits  URINALYSIS, ROUTINE W REFLEX MICROSCOPIC - Abnormal; Notable for the following components:   Leukocytes,Ua TRACE (*)    All other components within normal limits  URINALYSIS, MICROSCOPIC (REFLEX) - Abnormal; Notable for the following components:   Bacteria, UA FEW (*)    All other components within normal limits  PREGNANCY, URINE  HIV ANTIBODY (ROUTINE TESTING W REFLEX)  GC/CHLAMYDIA PROBE AMP ([place]) NOT AT [facility]    EKG None  Radiology No results found.  Procedures Procedures    Medications Ordered in ED Medications  cefTRIAXone (ROCEPHIN) injection 1 g (has no administration in time range)  lidocaine (PF) (XYLOCAINE) 1 % injection 1-2.1 mL (has no administration in time range)    ED Course/ Medical Decision Making/ A&P                             Medical Decision Making Amount and/or Complexity of Data Reviewed Labs: ordered.  This patient presents to the ED for concern of vaginal discharge, concern for STD, this involves an extensive number of treatment options, and is a complaint that carries with it a high risk of complications and morbidity.  The differential diagnosis includes chlamydia, gonorrhea, BV, trichomonas, yeast, other sexually transmitted infection, cystitis  Co morbidities that complicate the patient evaluation  asthma, hypertension  My initial workup includes labs, pelvic exam  Additional history obtained from: Nursing notes from this visit.  I ordered, reviewed and interpreted labs which include: Urinalysis, UPT, wet prep, GC/chlamydia  Afebrile, hypertensive but otherwise  hemodynamically stable.  37 year old female presents ED for evaluation of vaginal discharge.  She has had a new sexual partner recently and is concerned for an STD.  She also states she is currently on birth control as she just had her IUD taken out and is not scheduled to have another IUD placed until next week.  She reports a small amount of vaginal spotting.  She also reports some yellow discharge.  She has some pelvic pressure as well.  She appears very well on physical exam.  She has no cervical motion or adnexal tenderness.  No suspicion for PID at this time.  I had a shared decision-making conversation with the patient regarding empiric treatment.  She would like to be treated today.  This is reasonable as she is high risk.  Wet prep was positive for clue cells.  She will be treated for bacterial vaginosis.  She has follow-up with her primary care provider next week for IUD placement.  She was encouraged to keep this appointment.  She was given return precautions.  Stable at discharge.  At this time there does not appear to be any evidence of an acute emergency medical condition and the patient appears stable for discharge with appropriate outpatient follow up. Diagnosis was discussed with patient who verbalizes understanding of care plan and is agreeable to discharge. I have discussed return precautions with patient who verbalizes understanding. Patient encouraged to follow-up with their PCP within 1 week. All questions answered.  Note: Portions of this report may have been transcribed using voice recognition software. Every effort was made to ensure accuracy; however, inadvertent computerized transcription errors may still be present.        Final Clinical Impression(s) / ED Diagnoses Final diagnoses:  Possible exposure to STD  Bacterial vaginosis    Rx / DC Orders ED Discharge Orders          Ordered    doxycycline (VIBRA-TABS) 100 MG tablet  2 times daily        08/17/22 1808     metroNIDAZOLE (FLAGYL) 500 MG tablet  2 times daily        08/17/22 1809              Mora Bellman 08/17/22 1812    Alvira Monday, MD 08/18/22 2237

## 2022-08-17 NOTE — Discharge Instructions (Addendum)
You have been seen today for your complaint of concern for STDs. Your lab work showed bacterial vaginosis.  The rest of the lab workup should be done in 48-72 hours Your discharge medications include doxycycline and metronidazole. This is an antibiotic. You should take it as prescribed. You should take it for the entire duration of the prescription. This may cause an upset stomach. This is normal. You may take this with food. You may also eat yogurt to prevent diarrhea.  You cannot drink alcohol while taking metronidazole.  It will cause significant vomiting. Home care instructions are as follows:  Practice safe sex Follow up with: Primary care provider next week as scheduled Please seek immediate medical care if you develop any of the following symptoms: New or worsening symptoms At this time there does not appear to be the presence of an emergent medical condition, however there is always the potential for conditions to change. Please read and follow the below instructions.  Do not take your medicine if  develop an itchy rash, swelling in your mouth or lips, or difficulty breathing; call 911 and seek immediate emergency medical attention if this occurs.  You may review your lab tests and imaging results in their entirety on your MyChart account.  Please discuss all results of fully with your primary care provider and other specialist at your follow-up visit.  Note: Portions of this text may have been transcribed using voice recognition software. Every effort was made to ensure accuracy; however, inadvertent computerized transcription errors may still be present.

## 2022-08-17 NOTE — ED Triage Notes (Signed)
Pt states cheated on partner and is having pain in private area, concern for STD x 3 days  States continuous discharge

## 2022-08-18 LAB — GC/CHLAMYDIA PROBE AMP (~~LOC~~) NOT AT ARMC
Chlamydia: NEGATIVE
Comment: NEGATIVE
Comment: NORMAL
Neisseria Gonorrhea: NEGATIVE

## 2022-08-18 LAB — HIV ANTIBODY (ROUTINE TESTING W REFLEX): HIV Screen 4th Generation wRfx: NONREACTIVE

## 2022-10-25 ENCOUNTER — Emergency Department (HOSPITAL_BASED_OUTPATIENT_CLINIC_OR_DEPARTMENT_OTHER)
Admission: EM | Admit: 2022-10-25 | Discharge: 2022-10-25 | Disposition: A | Discharge status: Home / Self Care | Payer: Medicaid Other | Attending: Emergency Medicine | Admitting: Emergency Medicine

## 2022-10-25 ENCOUNTER — Emergency Department (HOSPITAL_BASED_OUTPATIENT_CLINIC_OR_DEPARTMENT_OTHER): Payer: Medicaid Other

## 2022-10-25 ENCOUNTER — Encounter (HOSPITAL_BASED_OUTPATIENT_CLINIC_OR_DEPARTMENT_OTHER): Payer: Self-pay

## 2022-10-25 ENCOUNTER — Other Ambulatory Visit: Payer: Self-pay

## 2022-10-25 DIAGNOSIS — Z20822 Contact with and (suspected) exposure to covid-19: Secondary | ICD-10-CM | POA: Diagnosis not present

## 2022-10-25 DIAGNOSIS — Z7951 Long term (current) use of inhaled steroids: Secondary | ICD-10-CM | POA: Insufficient documentation

## 2022-10-25 DIAGNOSIS — Z79899 Other long term (current) drug therapy: Secondary | ICD-10-CM | POA: Diagnosis not present

## 2022-10-25 DIAGNOSIS — I10 Essential (primary) hypertension: Secondary | ICD-10-CM | POA: Insufficient documentation

## 2022-10-25 DIAGNOSIS — Z7952 Long term (current) use of systemic steroids: Secondary | ICD-10-CM | POA: Diagnosis not present

## 2022-10-25 DIAGNOSIS — R0602 Shortness of breath: Secondary | ICD-10-CM | POA: Diagnosis present

## 2022-10-25 DIAGNOSIS — J4521 Mild intermittent asthma with (acute) exacerbation: Secondary | ICD-10-CM | POA: Diagnosis not present

## 2022-10-25 LAB — SARS CORONAVIRUS 2 BY RT PCR: SARS Coronavirus 2 by RT PCR: NEGATIVE

## 2022-10-25 LAB — PREGNANCY, URINE: Preg Test, Ur: NEGATIVE

## 2022-10-25 MED ORDER — PREDNISONE 20 MG PO TABS: ORAL_TABLET | ORAL | 0 refills | Status: AC

## 2022-10-25 MED ORDER — METHYLPREDNISOLONE SODIUM SUCC 125 MG IJ SOLR
125.0000 mg | Freq: Once | INTRAMUSCULAR | Status: AC
  Administered 2022-10-25: 125 mg via INTRAMUSCULAR
  Filled 2022-10-25: qty 2

## 2022-10-25 MED ORDER — ALBUTEROL SULFATE HFA 108 (90 BASE) MCG/ACT IN AERS
2.0000 | INHALATION_SPRAY | Freq: Once | RESPIRATORY_TRACT | Status: AC
  Administered 2022-10-25: 2 via RESPIRATORY_TRACT
  Filled 2022-10-25: qty 6.7

## 2022-10-25 NOTE — ED Provider Notes (Signed)
[place] [facility] Provider Note   CSN: [id number] Arrival date & time: 10/25/22  1918     History  Chief Complaint  Patient presents with   Shortness of Breath    Kristine Warner is a 37 y.o. female history of asthma, hypertension here presenting with shortness of breath.  Patient states that she has been having congestion and subjective shortness of breath today.  She states that this is typical of her asthma exacerbation.  She has been using albuterol inhaler but she states that she ran out of her inhaler.  Patient denies any sick contacts.  Denies being pregnant.  Denies any recent travel or history of blood clots.  The history is provided by the patient.       Home Medications Prior to Admission medications   Medication Sig Start Date End Date Taking? Authorizing Provider  albuterol (ACCUNEB) 1.25 MG/3ML nebulizer solution Take 3 mLs (1.25 mg total) by nebulization every 6 (six) hours as needed for wheezing. 01/15/22   Jeannie Fend, PA-C  albuterol (VENTOLIN HFA) 108 (90 Base) MCG/ACT inhaler Inhale 1-2 puffs into the lungs every 6 (six) hours as needed for wheezing or shortness of breath. 12/08/19   Palumbo, April, MD  budesonide-formoterol [facility]) 160-4.5 MCG/ACT inhaler Inhale 2 puffs into the lungs 2 (two) times daily.    [provider]  cetirizine (ZYRTEC ALLERGY) 10 MG tablet Take 1 tablet (10 mg total) by mouth daily. 11/17/20   Gailen Shelter, PA  doxycycline (VIBRA-TABS) 100 MG tablet Take 1 tablet (100 mg total) by mouth 2 (two) times daily. 08/17/22   Schutt, Edsel Petrin, PA-C  fluticasone (FLONASE) 50 MCG/ACT nasal spray Place 2 sprays into both nostrils daily for 14 days. 11/17/20 12/01/20  Gailen Shelter, PA  hydrochlorothiazide (MICROZIDE) 12.5 MG capsule Take 12.5 mg by mouth daily.    [provider]  lisinopril (ZESTRIL) 10 MG tablet Take 10 mg by mouth daily.    [provider]   metroNIDAZOLE (FLAGYL) 500 MG tablet Take 1 tablet (500 mg total) by mouth 2 (two) times daily. 08/17/22   Schutt, Edsel Petrin, PA-C  promethazine-dextromethorphan (PROMETHAZINE-DM) 6.25-15 MG/5ML syrup Take 5 mLs by mouth 4 (four) times daily as needed for cough. 03/25/21   Gailen Shelter, PA      Allergies    Patient has no known allergies.    Review of Systems   Review of Systems  Respiratory:  Positive for shortness of breath.   All other systems reviewed and are negative.   Physical Exam Updated Vital Signs BP (!) 151/128 (BP Location: Left Wrist)   Pulse (!) 106   Temp 98.1 F (36.7 C) (Oral)   Resp (!) 23   Ht 5\' 11"  (1.803 m)   Wt (!) 197.8 kg   SpO2 98%   BMI 60.81 kg/m  Physical Exam Vitals and nursing note reviewed.  Constitutional:      Appearance: She is well-developed.     Comments: Slightly tachypneic  HENT:     Head: Normocephalic.  Eyes:     Extraocular Movements: Extraocular movements intact.     Pupils: Pupils are equal, round, and reactive to light.  Cardiovascular:     Rate and Rhythm: Normal rate and regular rhythm.  Pulmonary:     Comments: Slightly tachypneic and diminished bilaterally.  No wheezing or crackles Musculoskeletal:        General: Normal range of motion.     Cervical  back: Normal range of motion and neck supple.  Skin:    General: Skin is warm.     Capillary Refill: Capillary refill takes less than 2 seconds.  Neurological:     General: No focal deficit present.     Mental Status: She is alert and oriented to person, place, and time.  Psychiatric:        Mood and Affect: Mood normal.        Behavior: Behavior normal.     ED Results / Procedures / Treatments   Labs (all labs ordered are listed, but only abnormal results are displayed) Labs Reviewed  SARS CORONAVIRUS 2 BY RT PCR    EKG None  Radiology DG Chest [facility] 1 View  Result Date: 10/25/2022 CLINICAL DATA:  Shortness of breath, history of asthma. EXAM:  PORTABLE CHEST 1 VIEW COMPARISON:  05/05/2022. FINDINGS: Examination is limited due to patient's body habitus. The heart size and mediastinal contours are within normal limits. There is limited visualization of the left lung base. No definite consolidation, effusion, or pneumothorax. No acute osseous abnormality. IMPRESSION: No active disease. Electronically Signed   By: Thornell Sartorius M.D.   On: 10/25/2022 20:23    Procedures Procedures    Medications Ordered in ED Medications  methylPREDNISolone sodium succinate (SOLU-MEDROL) 125 mg/2 mL injection 125 mg (125 mg Intramuscular Given 10/25/22 1941)  albuterol (VENTOLIN HFA) 108 (90 Base) MCG/ACT inhaler 2 puff (2 puffs Inhalation Given 10/25/22 1957)    ED Course/ Medical Decision Making/ A&P                                 Medical Decision Making [name] is a 37 y.o. female here presenting with shortness of breath.  Patient likely has asthma exacerbation.  Will check for COVID and get chest x-ray.  Will give Solu-Medrol IM and albuterol.  8:38 PM I reassessed patient and chest x-ray is clear.  Her lungs are clear.  Pregnancy test is negative.  Will discharge home with steroids.  Patient was given albuterol MDI   Problems Addressed: Mild intermittent asthma with exacerbation: acute illness or injury  Amount and/or Complexity of Data Reviewed Labs: ordered. Decision-making details documented in ED Course. Radiology: ordered and independent interpretation performed. Decision-making details documented in ED Course. ECG/medicine tests: ordered and independent interpretation performed. Decision-making details documented in ED Course.  Risk Prescription drug management.    Final Clinical Impression(s) / ED Diagnoses Final diagnoses:  None    Rx / DC Orders ED Discharge Orders     None         Charlynne Pander, MD 10/25/22 2111

## 2022-10-25 NOTE — Discharge Instructions (Addendum)
You have asthma exacerbation.  Your pregnancy test is negative today  Please take prednisone as prescribed  Use albuterol every 4 hours as needed  See your doctor for follow-up  Return to ER if you have worse shortness of breath, trouble breathing, fever

## 2022-10-25 NOTE — ED Triage Notes (Signed)
POV from home, A&O x 4, GCS 15, amb to triage  Sob since yesterday, hx asthma, out of rescue inhaler, called PCP and recommended to come in for breathing tx and steroids. Pt able to speak in complete sentences but labored breathing.

## 2022-10-25 NOTE — ED Notes (Signed)
Pt given specimen cup for urine sample

## 2022-11-18 IMAGING — DX DG CHEST 1V PORT
1 series · 1 of 1 positions shown · non-contrast
Comparison: May 22, 2017.

CLINICAL DATA: Shortness of breath, history of asthma.

EXAM:
PORTABLE CHEST 1 VIEW

[chest ap]
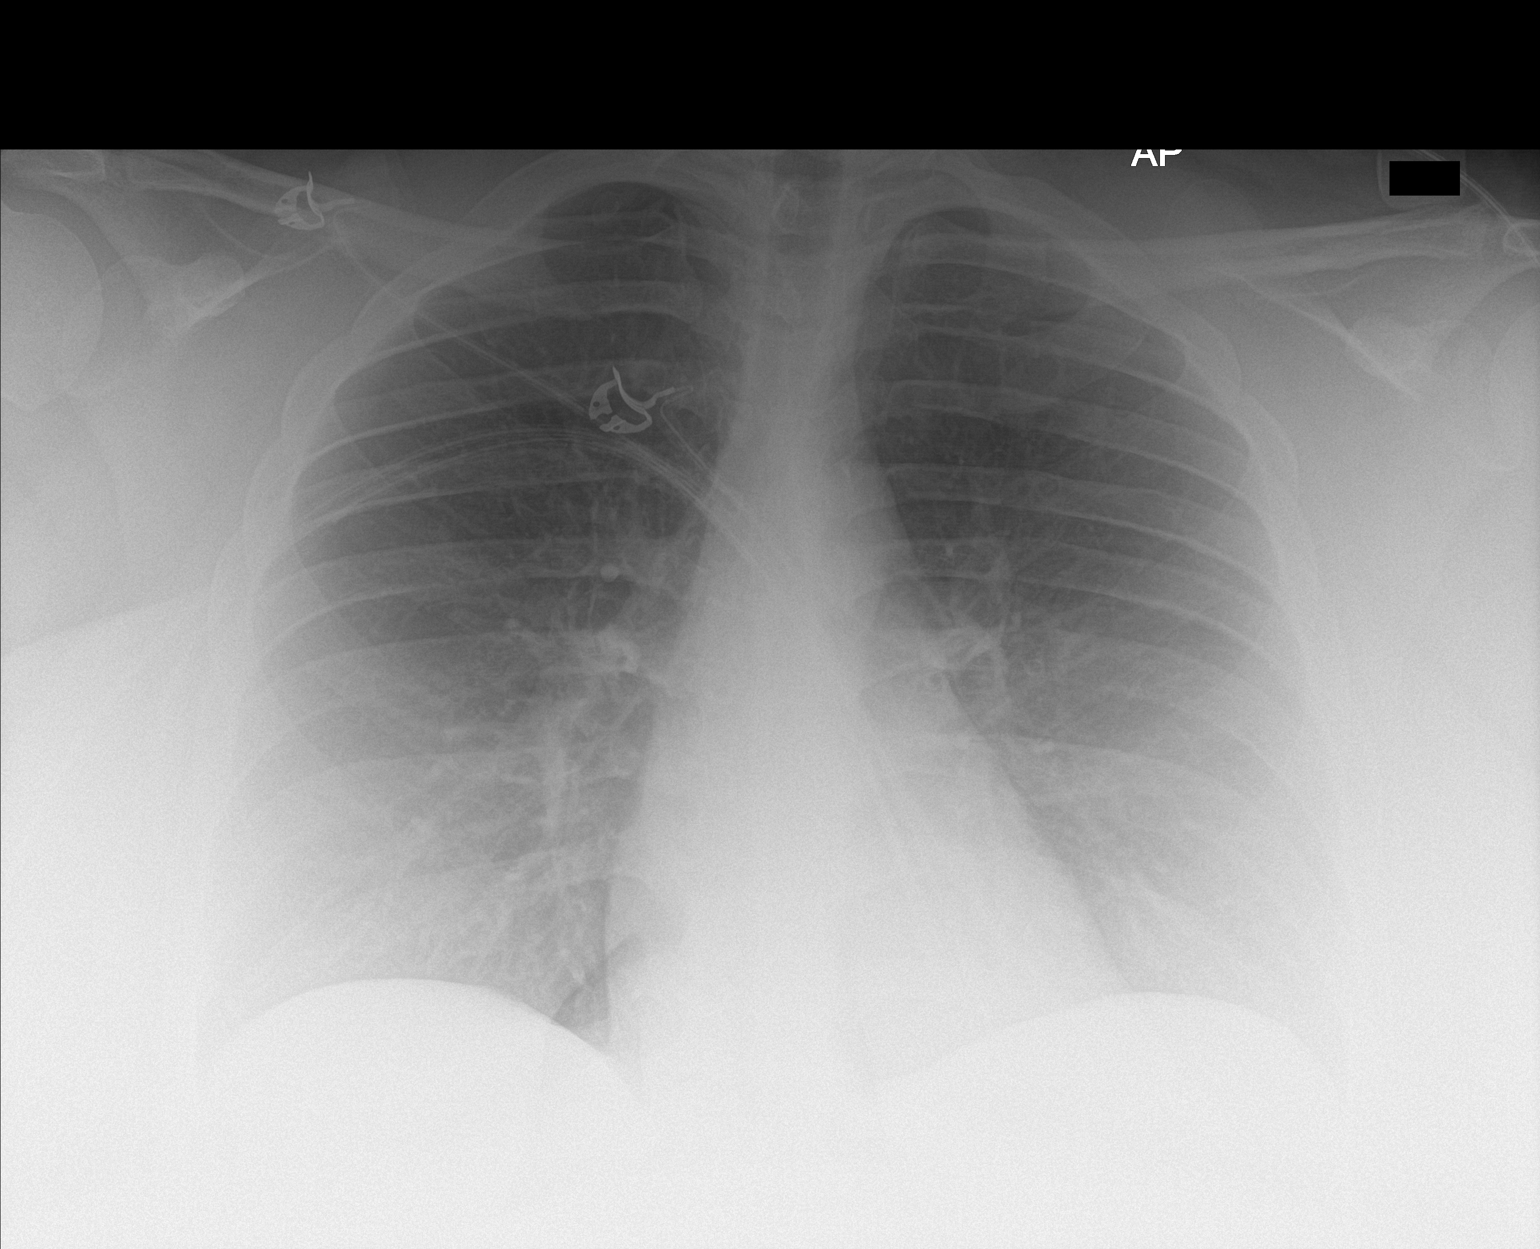

[1 of 1 positions shown; findings below may reference images not displayed]

FINDINGS: EKG leads project over the chest.

Cardiomediastinal contours and hilar structures are normal.

No consolidation.  No pleural effusion.

On limited assessment there is no acute skeletal process.
IMPRESSION: No acute cardiopulmonary disease.

## 2023-07-05 ENCOUNTER — Encounter (HOSPITAL_BASED_OUTPATIENT_CLINIC_OR_DEPARTMENT_OTHER): Payer: Self-pay | Admitting: Urology

## 2023-07-05 ENCOUNTER — Emergency Department (HOSPITAL_BASED_OUTPATIENT_CLINIC_OR_DEPARTMENT_OTHER)
Admission: EM | Admit: 2023-07-05 | Discharge: 2023-07-05 | Disposition: A | Discharge status: Home / Self Care | Attending: Emergency Medicine | Admitting: Emergency Medicine

## 2023-07-05 ENCOUNTER — Other Ambulatory Visit: Payer: Self-pay

## 2023-07-05 DIAGNOSIS — R112 Nausea with vomiting, unspecified: Secondary | ICD-10-CM | POA: Diagnosis present

## 2023-07-05 DIAGNOSIS — R1013 Epigastric pain: Secondary | ICD-10-CM | POA: Diagnosis not present

## 2023-07-05 DIAGNOSIS — J45909 Unspecified asthma, uncomplicated: Secondary | ICD-10-CM | POA: Insufficient documentation

## 2023-07-05 DIAGNOSIS — I10 Essential (primary) hypertension: Secondary | ICD-10-CM | POA: Diagnosis not present

## 2023-07-05 LAB — URINALYSIS, ROUTINE W REFLEX MICROSCOPIC
Bilirubin Urine: NEGATIVE
Glucose, UA: NEGATIVE mg/dL
Hgb urine dipstick: NEGATIVE
Ketones, ur: NEGATIVE mg/dL
Leukocytes,Ua: NEGATIVE
Nitrite: POSITIVE — AB
Protein, ur: NEGATIVE mg/dL
Specific Gravity, Urine: >=1.03 (ref 1.005–1.030)
pH: 5.5 (ref 5.0–8.0)

## 2023-07-05 LAB — COMPREHENSIVE METABOLIC PANEL WITH GFR
ALT: 15 U/L (ref 0–44)
AST: 14 U/L — ABNORMAL LOW (ref 15–41)
Albumin: 3.8 g/dL (ref 3.5–5.0)
Alkaline Phosphatase: 82 U/L (ref 38–126)
Anion gap: 10 (ref 5–15)
BUN: 10 mg/dL (ref 6–20)
CO2: 25 mmol/L (ref 22–32)
Calcium: 9 mg/dL (ref 8.9–10.3)
Chloride: 107 mmol/L (ref 98–111)
Creatinine, Ser: 0.9 mg/dL (ref 0.44–1.00)
GFR, Estimated: >60 mL/min (ref >60)
Glucose, Bld: 71 mg/dL (ref 70–99)
Potassium: 3.8 mmol/L (ref 3.5–5.1)
Sodium: 141 mmol/L (ref 135–145)
Total Bilirubin: <0.2 mg/dL (ref 0.0–1.2)
Total Protein: 7.1 g/dL (ref 6.5–8.1)

## 2023-07-05 LAB — CBC
HCT: 40.1 % (ref 36.0–46.0)
Hemoglobin: 13 g/dL (ref 12.0–15.0)
MCH: 31 pg (ref 26.0–34.0)
MCHC: 32.4 g/dL (ref 30.0–36.0)
MCV: 95.5 fL (ref 80.0–100.0)
Platelets: 365 10*3/uL (ref 150–400)
RBC: 4.2 MIL/uL (ref 3.87–5.11)
RDW: 14.5 % (ref 11.5–15.5)
WBC: 8.6 10*3/uL (ref 4.0–10.5)
nRBC: 0 % (ref 0.0–0.2)

## 2023-07-05 LAB — URINALYSIS, MICROSCOPIC (REFLEX): RBC / HPF: NONE SEEN RBC/hpf (ref 0–5)

## 2023-07-05 LAB — PREGNANCY, URINE: Preg Test, Ur: NEGATIVE

## 2023-07-05 LAB — LIPASE, BLOOD: Lipase: 27 U/L (ref 11–51)

## 2023-07-05 MED ORDER — PROMETHAZINE HCL 25 MG/ML IJ SOLN
INTRAMUSCULAR | Status: DC
Start: 2023-07-05 — End: 2023-07-05
  Filled 2023-07-05: qty 1

## 2023-07-05 MED ORDER — ONDANSETRON 4 MG PO TBDP: 4.0000 mg | ORAL_TABLET | Freq: Three times a day (TID) | ORAL | 0 refills | Status: AC | PRN

## 2023-07-05 MED ORDER — PROMETHAZINE HCL 25 MG PO TABS
25.0000 mg | ORAL_TABLET | Freq: Four times a day (QID) | ORAL | 0 refills | Status: AC | PRN
Start: 2023-07-05 — End: ?

## 2023-07-05 MED ORDER — ONDANSETRON HCL 4 MG/2ML IJ SOLN
4.0000 mg | Freq: Once | INTRAMUSCULAR | Status: AC | PRN
  Administered 2023-07-05: 4 mg via INTRAVENOUS
  Filled 2023-07-05: qty 2

## 2023-07-05 MED ORDER — SODIUM CHLORIDE 0.9 % IV SOLN
12.5000 mg | Freq: Once | INTRAVENOUS | Status: AC
  Administered 2023-07-05: 12.5 mg via INTRAVENOUS
  Filled 2023-07-05: qty 0.5

## 2023-07-05 MED ORDER — SODIUM CHLORIDE 0.9 % IV BOLUS
1000.0000 mL | Freq: Once | INTRAVENOUS | Status: AC
  Administered 2023-07-05: 1000 mL via INTRAVENOUS

## 2023-07-05 MED ORDER — PROMETHAZINE HCL 25 MG RE SUPP
25.0000 mg | Freq: Four times a day (QID) | RECTAL | 0 refills | Status: AC | PRN
Start: 2023-07-05 — End: ?

## 2023-07-05 MED ORDER — CEFADROXIL 500 MG PO CAPS: 500.0000 mg | ORAL_CAPSULE | Freq: Two times a day (BID) | ORAL | 0 refills | Status: AC

## 2023-07-05 NOTE — ED Provider Notes (Signed)
 [place] [facility] Provider Note   CSN: [id number] Arrival date & time: 07/05/23  1500     History  Chief Complaint  Patient presents with   Emesis    Kristine Warner is a 38 y.o. female.   Emesis   38 year old female presents emergency department with complaints of nausea, vomiting.  Patient states that she was at [employer] earlier today and ate a cheddar biscuit.  After a minute or so, developed nausea and had a few episodes of vomiting.  States that she developed some upper middle abdominal pain after she began vomiting which she states is worse with retching.  Denies any fevers, chills, hematemesis, urinary symptoms, change in bowel habits.  States that she is concerned that she have contracted food poisoning.  Past medical history significant for hypertension, asthma  Home Medications Prior to Admission medications   Medication Sig Start Date End Date Taking? Authorizing Provider  albuterol  (ACCUNEB ) 1.25 MG/3ML nebulizer solution Take 3 mLs (1.25 mg total) by nebulization every 6 (six) hours as needed for wheezing. 01/15/22   Darlis Eisenmenger, PA-C  albuterol  (VENTOLIN  HFA) 108 (90 Base) MCG/ACT inhaler Inhale 1-2 puffs into the lungs every 6 (six) hours as needed for wheezing or shortness of breath. 12/08/19   Palumbo, April, MD  budesonide-formoterol (SYMBICORT) 160-4.5 MCG/ACT inhaler Inhale 2 puffs into the lungs 2 (two) times daily.    [provider]  cetirizine  (ZYRTEC  ALLERGY) 10 MG tablet Take 1 tablet (10 mg total) by mouth daily. 11/17/20   Coretta Dexter, PA  doxycycline  (VIBRA -TABS) 100 MG tablet Take 1 tablet (100 mg total) by mouth 2 (two) times daily. 08/17/22   Schutt, Coni Deep, PA-C  fluticasone  (FLONASE ) 50 MCG/ACT nasal spray Place 2 sprays into both nostrils daily for 14 days. 11/17/20 12/01/20  Coretta Dexter, PA  hydrochlorothiazide (MICROZIDE) 12.5 MG capsule Take 12.5 mg by mouth daily.    [provider]  lisinopril (ZESTRIL) 10 MG tablet Take 10 mg by mouth daily.    [provider]  metroNIDAZOLE  (FLAGYL ) 500 MG tablet Take 1 tablet (500 mg total) by mouth 2 (two) times daily. 08/17/22   Schutt, Coni Deep, PA-C  predniSONE  (DELTASONE ) 20 MG tablet Take 60 mg daily x 2 days then 40 mg daily x 2 days then 20 mg daily x 2 days 10/25/22   Dalene Duck, MD  promethazine -dextromethorphan (PROMETHAZINE -DM) 6.25-15 MG/5ML syrup Take 5 mLs by mouth 4 (four) times daily as needed for cough. 03/25/21   Coretta Dexter, PA      Allergies    Patient has no known allergies.    Review of Systems   Review of Systems  Gastrointestinal:  Positive for vomiting.  All other systems reviewed and are negative.   Physical Exam Updated Vital Signs BP (!) 147/101 (BP Location: Right Arm)   Pulse (!) 104   Resp 20   Ht 5\' 11"  (1.803 m)   Wt (!) 197.8 kg   SpO2 99%   BMI 60.82 kg/m  Physical Exam Vitals and nursing note reviewed.  Constitutional:      General: She is not in acute distress.    Appearance: She is well-developed.  HENT:     Head: Normocephalic and atraumatic.  Eyes:     Conjunctiva/sclera: Conjunctivae normal.  Cardiovascular:     Rate and Rhythm: Normal rate and regular rhythm.     Heart sounds: No murmur heard. Pulmonary:  Effort: Pulmonary effort is normal. No respiratory distress.     Breath sounds: Normal breath sounds.  Abdominal:     Palpations: Abdomen is soft.     Tenderness: There is no right CVA tenderness or left CVA tenderness. Negative signs include Murphy's sign and McBurney's sign.     Comments: Tenderness to palpation of epigastric region.  Musculoskeletal:        General: No swelling.     Cervical back: Neck supple.  Skin:    General: Skin is warm and dry.     Capillary Refill: Capillary refill takes less than 2 seconds.  Neurological:     Mental Status: She is alert.  Psychiatric:        Mood and Affect: Mood normal.      ED Results / Procedures / Treatments   Labs (all labs ordered are listed, but only abnormal results are displayed) Labs Reviewed  URINALYSIS, ROUTINE W REFLEX MICROSCOPIC - Abnormal; Notable for the following components:      Result Value   Nitrite POSITIVE (*)    All other components within normal limits  URINALYSIS, MICROSCOPIC (REFLEX) - Abnormal; Notable for the following components:   Bacteria, UA MANY (*)    All other components within normal limits  CBC  PREGNANCY, URINE  LIPASE, BLOOD  COMPREHENSIVE METABOLIC PANEL WITH GFR    EKG None  Radiology No results found.  Procedures Procedures    Medications Ordered in ED Medications  ondansetron (ZOFRAN) injection 4 mg (4 mg Intravenous Given 07/05/23 1528)    ED Course/ Medical Decision Making/ A&P                                 Medical Decision Making Amount and/or Complexity of Data Reviewed Labs: ordered.  Risk Prescription drug management.   This patient presents to the ED for concern of nausea vomiting, this involves an extensive number of treatment options, and is a complaint that carries with it a high risk of complications and morbidity.  The differential diagnosis includes foodborne illness, viral gastroenteritis, gastritis, pyelonephritis, nephrolithiasis, cystitis, SBO/LBO, volvulus, diverticulitis, appendicitis, other   Co morbidities that complicate the patient evaluation  See HPI   Additional history obtained:  Additional history obtained from EMR External records from outside source obtained and reviewed including hospital records   Lab Tests:  I Ordered, and personally interpreted labs.  The pertinent results include: No leukocytosis.  No evidence of anemia.  Platelets within range.  No electrolyte abnormalities.  No transaminitis.  No renal dysfunction.  Lipase within normal limits.  Urine pregnancy negative.  UA with many bacteria present, positive nitrite although contaminated  with 6-10 squamous epithelial cells.   Imaging Studies ordered:  N/a   Cardiac Monitoring: / EKG:  The patient was maintained on a cardiac monitor.  I personally viewed and interpreted the cardiac monitored which showed an underlying rhythm of: sinus rhythm   Consultations Obtained:  N/a   Problem List / ED Course / Critical interventions / Medication management  Nausea vomiting  I ordered medication including Zofran, normal saline,  Phenergan    Reevaluation of the patient after these medicines showed that the patient improved I have reviewed the patients home medicines and have made adjustments as needed   Social Determinants of Health:  Former cigarette use.  Denies illicit drug use.   Test / Admission - Considered:  Nausea vomiting  Vitals signs significant for  initial hypertension blood pressure hypertension blood pressure 125/88. Otherwise within normal range and stable throughout visit. Laboratory/imaging studies significant for: see above 38 year old female presents emergency department with complaints of nausea, vomiting.  Patient states that she was at [employer] earlier today and ate a cheddar biscuit.  After a minute or so, developed nausea and had a few episodes of vomiting.  States that she developed some upper middle abdominal pain after she began vomiting which she states is worse with retching.  Denies any fevers, chills, hematemesis, urinary symptoms, change in bowel habits.  States that she is concerned that she have contracted food poisoning. On exam, mild tenderness to be palpation epigastric region with negative Murphy sign.  Otherwise, abdomen nontender.  Labs concerning for possible UTI with nitrite positive, many bacteria in urine otherwise, labs unremarkable for any acute process.  Patient treated with IV fluids, antiemetic and noted resolution of symptoms subsequently tolerating p.o.  Suspect patient's symptoms likely secondary to foodborne illness  versus viral gastroenteritis.  Will continue to treat patient's symptoms with antiemetic in the outpatient setting, recommend dietary/lifestyle changes as described in AVS and recommend close follow-up with PCP in the outpatient setting.  Patient incidentally found with possible UTI with nitrite positive bacteria present urine; will treat empirically with Duricef although patient not currently symptomatic.  Considered emergent imaging of patient's abdomen but deemed unnecessary given patient's clinical presentation and resolution of symptoms with medications administered.  Treatment plan discussed with patient and she acknowledges that he was agreeable to the plan.  Patient overall well-appearing, afebrile in no acute distress, tolerating p.o. without difficulty. Worrisome signs and symptoms were discussed with the patient, and the patient acknowledged understanding to return to the ED if noticed. Patient was stable upon discharge.         Final Clinical Impression(s) / ED Diagnoses Final diagnoses:  None    Rx / DC Orders ED Discharge Orders     None         [place] Butter, [place] 07/05/23 1712    Dalene Duck, MD 07/06/23 [phone]

## 2023-07-05 NOTE — ED Notes (Signed)
 Pt given water for PO challenge.

## 2023-07-05 NOTE — ED Triage Notes (Signed)
 Pt states emesis that started today after eating biscuit from [employer] Denies fever or diarrhea

## 2023-07-05 NOTE — Discharge Instructions (Addendum)
 Your workup today was reassuring.  Labs did not show evidence of UTI which we will put you on antibiotics for.  Otherwise, your labs looked well.  Suspect that your symptoms are likely secondary to a foodborne illness.  Will send you home with nausea medicine to use as needed.  Recommend bland diet until you are able to tolerate more complex foods.  Recommend follow-up with your primary care for reassessment.  Please do not hesitate to return if the worrisome signs and symptoms we discussed become apparent.
